# Patient Record
Sex: Female | Born: 1950 | Race: White | Hispanic: No | Marital: Married | Smoking: Former smoker
Health system: Southern US, Community
[De-identification: ages and names within clinical notes are randomized; demographics above are authoritative.]

## PROBLEM LIST (undated history)

## (undated) DIAGNOSIS — N281 Cyst of kidney, acquired: Secondary | ICD-10-CM

## (undated) DIAGNOSIS — Z87891 Personal history of nicotine dependence: Secondary | ICD-10-CM

## (undated) DIAGNOSIS — I10 Essential (primary) hypertension: Secondary | ICD-10-CM

## (undated) HISTORY — PX: BACK SURGERY: SHX140

---

## 1998-10-16 ENCOUNTER — Other Ambulatory Visit: Admission: RE | Admit: 1998-10-16 | Discharge: 1998-10-16 | Payer: Self-pay | Admitting: Obstetrics and Gynecology

## 1998-11-12 ENCOUNTER — Encounter: Payer: Self-pay | Admitting: Family Medicine

## 1998-11-12 ENCOUNTER — Ambulatory Visit (HOSPITAL_COMMUNITY): Admission: RE | Admit: 1998-11-12 | Discharge: 1998-11-12 | Payer: Self-pay | Admitting: Family Medicine

## 1999-12-01 ENCOUNTER — Other Ambulatory Visit: Admission: RE | Admit: 1999-12-01 | Discharge: 1999-12-01 | Payer: Self-pay | Admitting: Obstetrics and Gynecology

## 1999-12-16 ENCOUNTER — Ambulatory Visit (HOSPITAL_COMMUNITY): Admission: RE | Admit: 1999-12-16 | Discharge: 1999-12-16 | Payer: Self-pay | Admitting: Family Medicine

## 1999-12-28 ENCOUNTER — Encounter: Admission: RE | Admit: 1999-12-28 | Discharge: 1999-12-28 | Payer: Self-pay | Admitting: Family Medicine

## 1999-12-28 ENCOUNTER — Encounter: Payer: Self-pay | Admitting: Family Medicine

## 2000-12-29 ENCOUNTER — Other Ambulatory Visit: Admission: RE | Admit: 2000-12-29 | Discharge: 2000-12-29 | Payer: Self-pay | Admitting: Obstetrics and Gynecology

## 2002-04-12 ENCOUNTER — Other Ambulatory Visit: Admission: RE | Admit: 2002-04-12 | Discharge: 2002-04-12 | Payer: Self-pay | Admitting: Obstetrics and Gynecology

## 2002-12-10 ENCOUNTER — Encounter: Payer: Self-pay | Admitting: Emergency Medicine

## 2002-12-10 ENCOUNTER — Emergency Department (HOSPITAL_COMMUNITY): Admission: EM | Admit: 2002-12-10 | Discharge: 2002-12-10 | Payer: Self-pay | Admitting: Emergency Medicine

## 2002-12-17 ENCOUNTER — Encounter: Payer: Self-pay | Admitting: Orthopedic Surgery

## 2002-12-18 ENCOUNTER — Encounter: Payer: Self-pay | Admitting: Orthopedic Surgery

## 2002-12-19 ENCOUNTER — Inpatient Hospital Stay (HOSPITAL_COMMUNITY): Admission: RE | Admit: 2002-12-19 | Discharge: 2002-12-20 | Payer: Self-pay | Admitting: Orthopedic Surgery

## 2005-02-26 ENCOUNTER — Ambulatory Visit: Payer: Self-pay | Admitting: Family Medicine

## 2005-03-07 ENCOUNTER — Ambulatory Visit: Payer: Self-pay | Admitting: *Deleted

## 2005-03-31 ENCOUNTER — Ambulatory Visit: Payer: Self-pay | Admitting: Family Medicine

## 2005-04-09 ENCOUNTER — Ambulatory Visit (HOSPITAL_COMMUNITY): Admission: RE | Admit: 2005-04-09 | Discharge: 2005-04-09 | Payer: Self-pay | Admitting: Family Medicine

## 2014-04-15 ENCOUNTER — Emergency Department (HOSPITAL_COMMUNITY): Payer: Medicare HMO

## 2014-04-15 ENCOUNTER — Emergency Department (HOSPITAL_COMMUNITY)
Admission: EM | Admit: 2014-04-15 | Discharge: 2014-04-15 | Disposition: A | Discharge status: Home / Self Care | Payer: Medicare HMO | Source: Home / Self Care | Attending: Emergency Medicine | Admitting: Emergency Medicine

## 2014-04-15 ENCOUNTER — Encounter (HOSPITAL_COMMUNITY): Payer: Self-pay | Admitting: Emergency Medicine

## 2014-04-15 DIAGNOSIS — I10 Essential (primary) hypertension: Secondary | ICD-10-CM

## 2014-04-15 DIAGNOSIS — F17210 Nicotine dependence, cigarettes, uncomplicated: Secondary | ICD-10-CM | POA: Diagnosis present

## 2014-04-15 DIAGNOSIS — R1013 Epigastric pain: Secondary | ICD-10-CM | POA: Diagnosis not present

## 2014-04-15 DIAGNOSIS — K8000 Calculus of gallbladder with acute cholecystitis without obstruction: Secondary | ICD-10-CM | POA: Diagnosis not present

## 2014-04-15 DIAGNOSIS — Z720 Tobacco use: Secondary | ICD-10-CM | POA: Insufficient documentation

## 2014-04-15 DIAGNOSIS — M545 Low back pain: Secondary | ICD-10-CM | POA: Diagnosis present

## 2014-04-15 DIAGNOSIS — N281 Cyst of kidney, acquired: Secondary | ICD-10-CM | POA: Diagnosis present

## 2014-04-15 DIAGNOSIS — G8929 Other chronic pain: Secondary | ICD-10-CM | POA: Diagnosis present

## 2014-04-15 DIAGNOSIS — Z7982 Long term (current) use of aspirin: Secondary | ICD-10-CM | POA: Insufficient documentation

## 2014-04-15 DIAGNOSIS — R0789 Other chest pain: Secondary | ICD-10-CM | POA: Insufficient documentation

## 2014-04-15 DIAGNOSIS — Z6829 Body mass index (BMI) 29.0-29.9, adult: Secondary | ICD-10-CM

## 2014-04-15 DIAGNOSIS — Z79899 Other long term (current) drug therapy: Secondary | ICD-10-CM

## 2014-04-15 DIAGNOSIS — F1290 Cannabis use, unspecified, uncomplicated: Secondary | ICD-10-CM | POA: Diagnosis present

## 2014-04-15 HISTORY — DX: Essential (primary) hypertension: I10

## 2014-04-15 LAB — BASIC METABOLIC PANEL
Anion gap: 13 (ref 5–15)
BUN: 15 mg/dL (ref 6–23)
CO2: 25 mEq/L (ref 19–32)
Calcium: 9.7 mg/dL (ref 8.4–10.5)
Chloride: 102 mEq/L (ref 96–112)
Creatinine, Ser: 0.78 mg/dL (ref 0.50–1.10)
GFR calc Af Amer: >90 mL/min (ref >90)
GFR calc non Af Amer: 86 mL/min — ABNORMAL LOW (ref >90)
Glucose, Bld: 92 mg/dL (ref 70–99)
Potassium: 3.7 mEq/L (ref 3.7–5.3)
Sodium: 140 mEq/L (ref 137–147)

## 2014-04-15 LAB — CBC
HCT: 37.8 % (ref 36.0–46.0)
Hemoglobin: 13.2 g/dL (ref 12.0–15.0)
MCH: 31.4 pg (ref 26.0–34.0)
MCHC: 34.9 g/dL (ref 30.0–36.0)
MCV: 90 fL (ref 78.0–100.0)
Platelets: 344 10*3/uL (ref 150–400)
RBC: 4.2 MIL/uL (ref 3.87–5.11)
RDW: 14.1 % (ref 11.5–15.5)
WBC: 11.1 10*3/uL — ABNORMAL HIGH (ref 4.0–10.5)

## 2014-04-15 LAB — I-STAT TROPONIN, ED
Troponin i, poc: 0.01 ng/mL (ref 0.00–0.08)
Troponin i, poc: 0.01 ng/mL (ref 0.00–0.08)

## 2014-04-15 LAB — TROPONIN I: Troponin I: <0.3 ng/mL (ref <0.30)

## 2014-04-15 MED ORDER — ONDANSETRON HCL 4 MG/2ML IJ SOLN
4.0000 mg | Freq: Once | INTRAMUSCULAR | Status: AC
  Administered 2014-04-15: 4 mg via INTRAVENOUS
  Filled 2014-04-15: qty 2

## 2014-04-15 MED ORDER — IBUPROFEN 800 MG PO TABS: 800.0000 mg | ORAL_TABLET | Freq: Three times a day (TID) | ORAL | Status: DC | PRN

## 2014-04-15 MED ORDER — GI COCKTAIL ~~LOC~~
30.0000 mL | Freq: Once | ORAL | Status: AC
  Administered 2014-04-15: 30 mL via ORAL
  Filled 2014-04-15: qty 30

## 2014-04-15 MED ORDER — MORPHINE SULFATE 4 MG/ML IJ SOLN
4.0000 mg | Freq: Once | INTRAMUSCULAR | Status: AC
  Administered 2014-04-15: 4 mg via INTRAVENOUS
  Filled 2014-04-15: qty 1

## 2014-04-15 MED ORDER — KETOROLAC TROMETHAMINE 30 MG/ML IJ SOLN
30.0000 mg | Freq: Once | INTRAMUSCULAR | Status: AC
  Administered 2014-04-15: 30 mg via INTRAVENOUS
  Filled 2014-04-15: qty 1

## 2014-04-15 NOTE — ED Notes (Signed)
Dr. Kohut at bedside 

## 2014-04-15 NOTE — ED Notes (Signed)
Patient states chest pain starting tonight at 0130, patient states centralized chest pain radiating into back, patient denies n/v or diaphoresis, patient with htn and smoking history

## 2014-04-15 NOTE — Discharge Instructions (Signed)
Read the information below.  Use the prescribed medication as directed.  Please discuss all new medications with your pharmacist.  You may return to the Emergency Department at any time for worsening condition or any new symptoms that concern you.  If you develop worsening chest pain, shortness of breath, fever, you pass out, or become weak or dizzy, return to the ER for a recheck.      Chest Pain (Nonspecific) It is often hard to give a specific diagnosis for the cause of chest pain. There is always a chance that your pain could be related to something serious, such as a heart attack or a blood clot in the lungs. You need to follow up with your health care provider for further evaluation. CAUSES   Heartburn.  Pneumonia or bronchitis.  Anxiety or stress.  Inflammation around your heart (pericarditis) or lung (pleuritis or pleurisy).  A blood clot in the lung.  A collapsed lung (pneumothorax). It can develop suddenly on its own (spontaneous pneumothorax) or from trauma to the chest.  Shingles infection (herpes zoster virus). The chest wall is composed of bones, muscles, and cartilage. Any of these can be the source of the pain.  The bones can be bruised by injury.  The muscles or cartilage can be strained by coughing or overwork.  The cartilage can be affected by inflammation and become sore (costochondritis). DIAGNOSIS  Lab tests or other studies may be needed to find the cause of your pain. Your health care provider may have you take a test called an ambulatory electrocardiogram (ECG). An ECG records your heartbeat patterns over a 24-hour period. You may also have other tests, such as:  Transthoracic echocardiogram (TTE). During echocardiography, sound waves are used to evaluate how blood flows through your heart.  Transesophageal echocardiogram (TEE).  Cardiac monitoring. This allows your health care provider to monitor your heart rate and rhythm in real time.  Holter monitor.  This is a portable device that records your heartbeat and can help diagnose heart arrhythmias. It allows your health care provider to track your heart activity for several days, if needed.  Stress tests by exercise or by giving medicine that makes the heart beat faster. TREATMENT   Treatment depends on what may be causing your chest pain. Treatment may include:  Acid blockers for heartburn.  Anti-inflammatory medicine.  Pain medicine for inflammatory conditions.  Antibiotics if an infection is present.  You may be advised to change lifestyle habits. This includes stopping smoking and avoiding alcohol, caffeine, and chocolate.  You may be advised to keep your head raised (elevated) when sleeping. This reduces the chance of acid going backward from your stomach into your esophagus. Most of the time, nonspecific chest pain will improve within 2-3 days with rest and mild pain medicine.  HOME CARE INSTRUCTIONS   If antibiotics were prescribed, take them as directed. Finish them even if you start to feel better.  For the next few days, avoid physical activities that bring on chest pain. Continue physical activities as directed.  Do not use any tobacco products, including cigarettes, chewing tobacco, or electronic cigarettes.  Avoid drinking alcohol.  Only take medicine as directed by your health care provider.  Follow your health care provider's suggestions for further testing if your chest pain does not go away.  Keep any follow-up appointments you made. If you do not go to an appointment, you could develop lasting (chronic) problems with pain. If there is any problem keeping an appointment, call  to reschedule. SEEK MEDICAL CARE IF:   Your chest pain does not go away, even after treatment.  You have a rash with blisters on your chest.  You have a fever. SEEK IMMEDIATE MEDICAL CARE IF:   You have increased chest pain or pain that spreads to your arm, neck, jaw, back, or  abdomen.  You have shortness of breath.  You have an increasing cough, or you cough up blood.  You have severe back or abdominal pain.  You feel nauseous or vomit.  You have severe weakness.  You faint.  You have chills. This is an emergency. Do not wait to see if the pain will go away. Get medical help at once. Call your local emergency services (911 in [place] Do not drive yourself to the hospital. MAKE SURE YOU:   Understand these instructions.  Will watch your condition.  Will get help right away if you are not doing well or get worse. Document Released: 04/07/2005 Document Revised: 07/03/2013 Document Reviewed: 02/01/2008 [facility] Patient Information 2015 [employer], [place]. This information is not intended to replace advice given to you by your health care provider. Make sure you discuss any questions you have with your health care provider.

## 2014-04-15 NOTE — ED Provider Notes (Signed)
CSN: [id number]     Arrival date & time 04/15/14  0348 History   First MD Initiated Contact with Patient 04/15/14 0435     Chief Complaint  Patient presents with  . Chest Pain     (Consider location/radiation/quality/duration/timing/severity/associated sxs/prior Treatment) The history is provided by the patient.    Patient with hx HTN, smoking presents with central chest pain.  Pain began around 1:30am when she got home from work, was sitting down, drinking milk.  Pain began gradually and has steadily worsened.  Pain is located in the midsternal area, described as pressure, gradually increasing, 9/10 intensity now.  No associated symptoms.  No exacerbating or palliative factors.  Is starting to have some nausea secondary to pain.  Took aspirin, tums, alprazolam, and benadryl without improvement. Specifically denies SOB, lightheadedness, dizziness, cough, fevers, recent illness, abdominal pain.  The pain is not exertional, is not pleuritic, is not positional.  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  Has family hx of CAD, father had MI in his late 7640s.  No other known family hx CAD.  No personal or family hx blood clots.  No recent leg swelling or immobilization.  No exogenous estrogen.    Past Medical History  Diagnosis Date  . Hypertension    Past Surgical History  Procedure Laterality Date  . Back surgery     No family history on file. History  Substance Use Topics  . Smoking status: Current Some Day Smoker  . Smokeless tobacco: Not on file  . Alcohol Use: No   OB History   Grav Para Term Preterm Abortions TAB SAB Ect Mult Living                 Review of Systems  All other systems reviewed and are negative.     Allergies  Review of patient's allergies indicates no known allergies.  Home Medications   Prior to Admission medications    Medication Sig Start Date End Date Taking? Authorizing Provider  ALPRAZolam Prudy Feeler(XANAX) 1 MG tablet Take 1 mg by mouth 3 (three) times daily as needed for anxiety.   Yes Historical Provider, MD  Ascorbic Acid (VITAMIN C PO) Take 1 tablet by mouth 2 (two) times daily.   Yes Historical Provider, MD  aspirin 325 MG tablet Take 325 mg by mouth daily as needed (back pain).   Yes Historical Provider, MD  buPROPion (WELLBUTRIN SR) 150 MG 12 hr tablet Take 150 mg by mouth 2 (two) times daily.   Yes Historical Provider, MD  Calcium Carb-Cholecalciferol (CALCIUM 1000 + D PO) Take 1 tablet by mouth daily.   Yes Historical Provider, MD  Cholecalciferol (VITAMIN D PO) Take 1 tablet by mouth 2 (two) times daily.   Yes Historical Provider, MD  diphenhydrAMINE (BENADRYL) 25 mg capsule Take 25 mg by mouth every 6 (six) hours as needed for itching or allergies.   Yes Historical Provider, MD  HYDROcodone-acetaminophen (NORCO) 7.5-325 MG per tablet Take 1 tablet by mouth every 6 (six) hours as needed for moderate pain.   Yes Historical Provider, MD  lisinopril (PRINIVIL,ZESTRIL) 5 MG tablet Take 5 mg by mouth daily.   Yes Historical Provider, MD  Multiple Vitamin (MULTIVITAMIN WITH MINERALS) TABS tablet Take 1 tablet by mouth daily.   Yes Historical Provider, MD   BP 172/90  Pulse  86  Temp(Src) 98.1 F (36.7 C) (Oral)  Resp 11  Ht 5\' 1"  (1.549 m)  Wt 155 lb (70.308 kg)  BMI 29.30 kg/m2  SpO2 98% Physical Exam  Nursing note and vitals reviewed. Constitutional: She appears well-developed and well-nourished. No distress.  HENT:  Head: Normocephalic and atraumatic.  Neck: Neck supple.  Cardiovascular: Normal rate, regular rhythm and intact distal pulses.   Pulmonary/Chest: Effort normal and breath sounds normal. No respiratory distress. She has no wheezes. She has no rales.  Abdominal: Soft. She exhibits no distension. There is no tenderness. There is no rebound and no guarding.  Musculoskeletal: She exhibits no  edema and no tenderness.  Neurological: She is alert.  Skin: She is not diaphoretic.  Psychiatric: She has a normal mood and affect. Her behavior is normal.    ED Course  Procedures (including critical care time) Labs Review Labs Reviewed  CBC - Abnormal; Notable for the following:    WBC 11.1 (*)    All other components within normal limits  BASIC METABOLIC PANEL - Abnormal; Notable for the following:    GFR calc non Af Amer 86 (*)    All other components within normal limits  I-STAT TROPOININ, ED  Rosezena Sensor, ED    Imaging Review Dg Chest Port 1 View  04/15/2014   CLINICAL DATA:  Initial evaluation for chest pain  EXAM: PORTABLE CHEST - 1 VIEW  COMPARISON:  None.  FINDINGS: The cardiac and mediastinal silhouettes are within normal limits.  The lungs are normally inflated. No airspace consolidation, pleural effusion, or pulmonary edema is identified. There is no pneumothorax.  No acute osseous abnormality identified.  IMPRESSION: No acute cardiopulmonary abnormality.   Electronically Signed   By: Rise Mu M.D.   On: 04/15/2014 05:25     EKG Interpretation None       Date: 04/15/2014  Rate: 80  Rhythm: normal sinus rhythm and premature ventricular contractions (PVC)  QRS Axis: normal  Intervals: normal  ST/T Wave abnormalities: normal  Conduction Disutrbances:none  Narrative Interpretation: possible left atrial enlargement.   Old EKG Reviewed: none available    Well's PE criteria score zero.  HEART score 2  Discussed pt with Dr Juleen China who will also see the patient.  8:10 AM Discussed with Dr Juleen China.  Will repeat troponin.  If negative, plan for d/c home.    MDM   Final diagnoses:  Atypical chest pain    Afebrile, nontoxic patient with central chest pain without associated symptoms or exacerbating or palliative factors.  Pain has been constant x hours.  Repeat troponins negative.  EKG unremarkable.  Pain control achieved in ED.  D/C home with  ibuprofen.  Pt has norco at home.   PCP follow up, Renaye Rakers. Discussed result, findings, treatment, and follow up  with patient.  Pt given return precautions.  Pt verbalizes understanding and agrees with plan.         Trixie Dredge, PA-C 04/15/14 1243

## 2014-04-17 ENCOUNTER — Emergency Department (HOSPITAL_COMMUNITY): Payer: Medicare HMO

## 2014-04-17 ENCOUNTER — Observation Stay (HOSPITAL_COMMUNITY): Payer: Medicare HMO

## 2014-04-17 ENCOUNTER — Encounter (HOSPITAL_COMMUNITY): Payer: Self-pay | Admitting: Emergency Medicine

## 2014-04-17 ENCOUNTER — Observation Stay (HOSPITAL_COMMUNITY): Payer: Medicare HMO | Admitting: Anesthesiology

## 2014-04-17 ENCOUNTER — Encounter (HOSPITAL_COMMUNITY): Admission: EM | Disposition: A | Discharge status: Home / Self Care | Payer: Self-pay | Source: Home / Self Care

## 2014-04-17 ENCOUNTER — Encounter (HOSPITAL_COMMUNITY): Payer: Medicare HMO | Admitting: Anesthesiology

## 2014-04-17 ENCOUNTER — Inpatient Hospital Stay (HOSPITAL_COMMUNITY)
Admission: EM | Admit: 2014-04-17 | Discharge: 2014-04-19 | DRG: 419 | Disposition: A | Discharge status: Home / Self Care | Payer: Medicare HMO | Attending: General Surgery | Admitting: General Surgery

## 2014-04-17 DIAGNOSIS — Z6829 Body mass index (BMI) 29.0-29.9, adult: Secondary | ICD-10-CM | POA: Diagnosis not present

## 2014-04-17 DIAGNOSIS — R1013 Epigastric pain: Secondary | ICD-10-CM | POA: Diagnosis present

## 2014-04-17 DIAGNOSIS — K8020 Calculus of gallbladder without cholecystitis without obstruction: Secondary | ICD-10-CM

## 2014-04-17 DIAGNOSIS — K8000 Calculus of gallbladder with acute cholecystitis without obstruction: Secondary | ICD-10-CM | POA: Diagnosis present

## 2014-04-17 DIAGNOSIS — K8010 Calculus of gallbladder with chronic cholecystitis without obstruction: Secondary | ICD-10-CM | POA: Diagnosis present

## 2014-04-17 DIAGNOSIS — Z87891 Personal history of nicotine dependence: Secondary | ICD-10-CM

## 2014-04-17 DIAGNOSIS — N281 Cyst of kidney, acquired: Secondary | ICD-10-CM | POA: Diagnosis present

## 2014-04-17 DIAGNOSIS — I10 Essential (primary) hypertension: Secondary | ICD-10-CM | POA: Diagnosis present

## 2014-04-17 DIAGNOSIS — G8929 Other chronic pain: Secondary | ICD-10-CM | POA: Diagnosis present

## 2014-04-17 DIAGNOSIS — F1290 Cannabis use, unspecified, uncomplicated: Secondary | ICD-10-CM | POA: Diagnosis present

## 2014-04-17 DIAGNOSIS — K819 Cholecystitis, unspecified: Secondary | ICD-10-CM

## 2014-04-17 DIAGNOSIS — F17210 Nicotine dependence, cigarettes, uncomplicated: Secondary | ICD-10-CM | POA: Diagnosis present

## 2014-04-17 DIAGNOSIS — M545 Low back pain: Secondary | ICD-10-CM | POA: Diagnosis present

## 2014-04-17 HISTORY — DX: Essential (primary) hypertension: I10

## 2014-04-17 HISTORY — PX: CHOLECYSTECTOMY: SHX55

## 2014-04-17 HISTORY — DX: Cyst of kidney, acquired: N28.1

## 2014-04-17 HISTORY — DX: Personal history of nicotine dependence: Z87.891

## 2014-04-17 LAB — BASIC METABOLIC PANEL
ANION GAP: 18 — AB (ref 5–15)
BUN: 13 mg/dL (ref 6–23)
CHLORIDE: 96 meq/L (ref 96–112)
CO2: 21 mEq/L (ref 19–32)
CREATININE: 0.72 mg/dL (ref 0.50–1.10)
Calcium: 10.3 mg/dL (ref 8.4–10.5)
GFR, EST NON AFRICAN AMERICAN: 88 mL/min — AB (ref >90)
Glucose, Bld: 99 mg/dL (ref 70–99)
Potassium: 3.9 mEq/L (ref 3.7–5.3)
Sodium: 135 mEq/L — ABNORMAL LOW (ref 137–147)

## 2014-04-17 LAB — D-DIMER, QUANTITATIVE: D-Dimer, Quant: 0.54 ug/mL-FEU — ABNORMAL HIGH (ref 0.00–0.48)

## 2014-04-17 LAB — CBC
HCT: 44.6 % (ref 36.0–46.0)
Hemoglobin: 15.5 g/dL — ABNORMAL HIGH (ref 12.0–15.0)
MCH: 31.9 pg (ref 26.0–34.0)
MCHC: 34.8 g/dL (ref 30.0–36.0)
MCV: 91.8 fL (ref 78.0–100.0)
PLATELETS: 273 10*3/uL (ref 150–400)
RBC: 4.86 MIL/uL (ref 3.87–5.11)
RDW: 14.1 % (ref 11.5–15.5)
WBC: 15.3 10*3/uL — AB (ref 4.0–10.5)

## 2014-04-17 LAB — I-STAT CG4 LACTIC ACID, ED: Lactic Acid, Venous: 1.56 mmol/L (ref 0.5–2.2)

## 2014-04-17 LAB — LIPASE, BLOOD: LIPASE: 25 U/L (ref 11–59)

## 2014-04-17 LAB — HEPATIC FUNCTION PANEL
ALBUMIN: 4.6 g/dL (ref 3.5–5.2)
ALK PHOS: 115 U/L (ref 39–117)
ALT: 71 U/L — AB (ref 0–35)
AST: 49 U/L — AB (ref 0–37)
Bilirubin, Direct: <0.2 mg/dL (ref 0.0–0.3)
Total Bilirubin: 0.3 mg/dL (ref 0.3–1.2)
Total Protein: 8.6 g/dL — ABNORMAL HIGH (ref 6.0–8.3)

## 2014-04-17 LAB — I-STAT TROPONIN, ED: Troponin i, poc: 0 ng/mL (ref 0.00–0.08)

## 2014-04-17 LAB — TROPONIN I

## 2014-04-17 SURGERY — LAPAROSCOPIC CHOLECYSTECTOMY WITH INTRAOPERATIVE CHOLANGIOGRAM
Anesthesia: General | Site: Abdomen

## 2014-04-17 MED ORDER — ROCURONIUM BROMIDE 100 MG/10ML IV SOLN
INTRAVENOUS | Status: AC
  Filled 2014-04-17: qty 1

## 2014-04-17 MED ORDER — NEOSTIGMINE METHYLSULFATE 10 MG/10ML IV SOLN
INTRAVENOUS | Status: DC | PRN
  Administered 2014-04-17: 3 mg via INTRAVENOUS

## 2014-04-17 MED ORDER — ONDANSETRON HCL 4 MG/2ML IJ SOLN
INTRAMUSCULAR | Status: AC
  Filled 2014-04-17: qty 2

## 2014-04-17 MED ORDER — MORPHINE SULFATE 4 MG/ML IJ SOLN
4.0000 mg | Freq: Once | INTRAMUSCULAR | Status: AC
  Administered 2014-04-17: 4 mg via INTRAVENOUS
  Filled 2014-04-17: qty 1

## 2014-04-17 MED ORDER — ONDANSETRON HCL 4 MG/2ML IJ SOLN
INTRAMUSCULAR | Status: DC | PRN
  Administered 2014-04-17: 4 mg via INTRAVENOUS

## 2014-04-17 MED ORDER — PROPOFOL 10 MG/ML IV BOLUS
INTRAVENOUS | Status: DC | PRN
  Administered 2014-04-17: 150 mg via INTRAVENOUS

## 2014-04-17 MED ORDER — LISINOPRIL 5 MG PO TABS
5.0000 mg | ORAL_TABLET | Freq: Every day | ORAL | Status: DC
  Administered 2014-04-18 – 2014-04-19 (×2): 5 mg via ORAL
  Filled 2014-04-17 (×3): qty 1

## 2014-04-17 MED ORDER — NEOSTIGMINE METHYLSULFATE 10 MG/10ML IV SOLN
INTRAVENOUS | Status: AC
  Filled 2014-04-17: qty 1

## 2014-04-17 MED ORDER — HYDROMORPHONE HCL 1 MG/ML IJ SOLN
0.2500 mg | INTRAMUSCULAR | Status: DC | PRN
  Administered 2014-04-17 (×2): 0.5 mg via INTRAVENOUS

## 2014-04-17 MED ORDER — ENOXAPARIN SODIUM 40 MG/0.4ML ~~LOC~~ SOLN
40.0000 mg | SUBCUTANEOUS | Status: DC
  Filled 2014-04-17: qty 0.4

## 2014-04-17 MED ORDER — ONDANSETRON HCL 4 MG/2ML IJ SOLN
4.0000 mg | Freq: Once | INTRAMUSCULAR | Status: AC
  Administered 2014-04-17: 4 mg via INTRAVENOUS
  Filled 2014-04-17: qty 2

## 2014-04-17 MED ORDER — NICOTINE 21 MG/24HR TD PT24
21.0000 mg | MEDICATED_PATCH | Freq: Every day | TRANSDERMAL | Status: DC
  Administered 2014-04-18 – 2014-04-19 (×2): 21 mg via TRANSDERMAL
  Filled 2014-04-17 (×3): qty 1

## 2014-04-17 MED ORDER — FENTANYL CITRATE 0.05 MG/ML IJ SOLN
INTRAMUSCULAR | Status: DC | PRN
  Administered 2014-04-17: 50 ug via INTRAVENOUS
  Administered 2014-04-17: 100 ug via INTRAVENOUS
  Administered 2014-04-17: 50 ug via INTRAVENOUS

## 2014-04-17 MED ORDER — DIATRIZOATE MEGLUMINE 30 % UR SOLN
URETHRAL | Status: DC | PRN
  Administered 2014-04-17: 14 mL

## 2014-04-17 MED ORDER — ALPRAZOLAM 1 MG PO TABS
1.0000 mg | ORAL_TABLET | Freq: Three times a day (TID) | ORAL | Status: DC | PRN
  Administered 2014-04-17 – 2014-04-19 (×2): 1 mg via ORAL
  Filled 2014-04-17 (×2): qty 1

## 2014-04-17 MED ORDER — MIDAZOLAM HCL 2 MG/2ML IJ SOLN
INTRAMUSCULAR | Status: AC
  Filled 2014-04-17: qty 2

## 2014-04-17 MED ORDER — HYDROMORPHONE HCL 1 MG/ML IJ SOLN
INTRAMUSCULAR | Status: AC
  Filled 2014-04-17: qty 1

## 2014-04-17 MED ORDER — ROCURONIUM BROMIDE 100 MG/10ML IV SOLN
INTRAVENOUS | Status: DC | PRN
  Administered 2014-04-17: 25 mg via INTRAVENOUS

## 2014-04-17 MED ORDER — LACTATED RINGERS IV SOLN
INTRAVENOUS | Status: DC | PRN
  Administered 2014-04-17: 1000 mL

## 2014-04-17 MED ORDER — PROMETHAZINE HCL 25 MG/ML IJ SOLN: 6.2500 mg | INTRAMUSCULAR | Status: DC | PRN

## 2014-04-17 MED ORDER — LIDOCAINE HCL (CARDIAC) 20 MG/ML IV SOLN
INTRAVENOUS | Status: DC | PRN
  Administered 2014-04-17: 40 mg via INTRAVENOUS

## 2014-04-17 MED ORDER — CEFAZOLIN SODIUM 1-5 GM-% IV SOLN
1.0000 g | Freq: Three times a day (TID) | INTRAVENOUS | Status: DC
  Administered 2014-04-17 – 2014-04-19 (×5): 1 g via INTRAVENOUS
  Filled 2014-04-17 (×6): qty 50

## 2014-04-17 MED ORDER — SODIUM CHLORIDE 0.9 % IV BOLUS (SEPSIS)
1000.0000 mL | Freq: Once | INTRAVENOUS | Status: AC
  Administered 2014-04-17: 1000 mL via INTRAVENOUS

## 2014-04-17 MED ORDER — HEPARIN SODIUM (PORCINE) 5000 UNIT/ML IJ SOLN
5000.0000 [IU] | Freq: Three times a day (TID) | INTRAMUSCULAR | Status: DC
  Administered 2014-04-17 – 2014-04-19 (×5): 5000 [IU] via SUBCUTANEOUS
  Filled 2014-04-17 (×8): qty 1

## 2014-04-17 MED ORDER — ACETAMINOPHEN 325 MG PO TABS: 650.0000 mg | ORAL_TABLET | Freq: Four times a day (QID) | ORAL | Status: DC | PRN

## 2014-04-17 MED ORDER — GLYCOPYRROLATE 0.2 MG/ML IJ SOLN
INTRAMUSCULAR | Status: DC | PRN
  Administered 2014-04-17: 0.4 mg via INTRAVENOUS

## 2014-04-17 MED ORDER — LACTATED RINGERS IV SOLN
INTRAVENOUS | Status: DC | PRN
  Administered 2014-04-17: 1000 mL
  Administered 2014-04-17: 11:00:00 via INTRAVENOUS

## 2014-04-17 MED ORDER — BUPIVACAINE-EPINEPHRINE (PF) 0.25% -1:200000 IJ SOLN
INTRAMUSCULAR | Status: AC
  Filled 2014-04-17: qty 30

## 2014-04-17 MED ORDER — IOHEXOL 300 MG/ML  SOLN
50.0000 mL | Freq: Once | INTRAMUSCULAR | Status: AC | PRN
Start: 2014-04-17 — End: 2014-04-17
  Administered 2014-04-17: 50 mL via ORAL

## 2014-04-17 MED ORDER — CEFAZOLIN SODIUM-DEXTROSE 2-3 GM-% IV SOLR
INTRAVENOUS | Status: AC
  Filled 2014-04-17: qty 50

## 2014-04-17 MED ORDER — CEFAZOLIN SODIUM-DEXTROSE 2-3 GM-% IV SOLR
2.0000 g | INTRAVENOUS | Status: AC
  Administered 2014-04-17: 2 g via INTRAVENOUS

## 2014-04-17 MED ORDER — ONDANSETRON HCL 4 MG/2ML IJ SOLN: 4.0000 mg | Freq: Four times a day (QID) | INTRAMUSCULAR | Status: DC | PRN

## 2014-04-17 MED ORDER — MORPHINE SULFATE 2 MG/ML IJ SOLN: 2.0000 mg | INTRAMUSCULAR | Status: DC | PRN

## 2014-04-17 MED ORDER — MORPHINE SULFATE 2 MG/ML IJ SOLN
1.0000 mg | INTRAMUSCULAR | Status: DC | PRN
  Administered 2014-04-18: 2 mg via INTRAVENOUS
  Filled 2014-04-17: qty 1

## 2014-04-17 MED ORDER — HYDROCODONE-ACETAMINOPHEN 5-325 MG PO TABS
1.0000 | ORAL_TABLET | ORAL | Status: DC | PRN
  Administered 2014-04-17 – 2014-04-19 (×6): 2 via ORAL
  Filled 2014-04-17 (×6): qty 2

## 2014-04-17 MED ORDER — ADULT MULTIVITAMIN W/MINERALS CH
1.0000 | ORAL_TABLET | Freq: Every day | ORAL | Status: DC
  Administered 2014-04-18 – 2014-04-19 (×2): 1 via ORAL
  Filled 2014-04-17 (×2): qty 1

## 2014-04-17 MED ORDER — FENTANYL CITRATE 0.05 MG/ML IJ SOLN
INTRAMUSCULAR | Status: AC
  Filled 2014-04-17: qty 5

## 2014-04-17 MED ORDER — PROPOFOL 10 MG/ML IV BOLUS
INTRAVENOUS | Status: AC
  Filled 2014-04-17: qty 20

## 2014-04-17 MED ORDER — ONDANSETRON HCL 4 MG PO TABS: 4.0000 mg | ORAL_TABLET | Freq: Four times a day (QID) | ORAL | Status: DC | PRN

## 2014-04-17 MED ORDER — LIDOCAINE HCL (CARDIAC) 20 MG/ML IV SOLN
INTRAVENOUS | Status: AC
  Filled 2014-04-17: qty 5

## 2014-04-17 MED ORDER — KCL IN DEXTROSE-NACL 40-5-0.45 MEQ/L-%-% IV SOLN
INTRAVENOUS | Status: DC
  Administered 2014-04-17: 100 mL/h via INTRAVENOUS
  Administered 2014-04-18 – 2014-04-19 (×3): via INTRAVENOUS
  Filled 2014-04-17 (×6): qty 1000

## 2014-04-17 MED ORDER — MIDAZOLAM HCL 5 MG/5ML IJ SOLN
INTRAMUSCULAR | Status: DC | PRN
  Administered 2014-04-17: 2 mg via INTRAVENOUS

## 2014-04-17 MED ORDER — LABETALOL HCL 5 MG/ML IV SOLN
INTRAVENOUS | Status: DC | PRN
  Administered 2014-04-17: 5 mg via INTRAVENOUS

## 2014-04-17 MED ORDER — IBUPROFEN 200 MG PO TABS: 600.0000 mg | ORAL_TABLET | Freq: Four times a day (QID) | ORAL | Status: DC | PRN

## 2014-04-17 MED ORDER — HYDROCODONE-ACETAMINOPHEN 5-325 MG PO TABS: 1.0000 | ORAL_TABLET | ORAL | Status: DC | PRN

## 2014-04-17 MED ORDER — PNEUMOCOCCAL VAC POLYVALENT 25 MCG/0.5ML IJ INJ
0.5000 mL | INJECTION | INTRAMUSCULAR | Status: AC
  Administered 2014-04-18: 0.5 mL via INTRAMUSCULAR
  Filled 2014-04-17 (×2): qty 0.5

## 2014-04-17 MED ORDER — SUCCINYLCHOLINE CHLORIDE 20 MG/ML IJ SOLN
INTRAMUSCULAR | Status: DC | PRN
  Administered 2014-04-17: 100 mg via INTRAVENOUS

## 2014-04-17 MED ORDER — BUPIVACAINE HCL (PF) 0.25 % IJ SOLN
INTRAMUSCULAR | Status: DC | PRN
  Administered 2014-04-17: 15 mL
  Administered 2014-04-17: 20 mL

## 2014-04-17 MED ORDER — DIPHENHYDRAMINE HCL 25 MG PO CAPS: 25.0000 mg | ORAL_CAPSULE | Freq: Four times a day (QID) | ORAL | Status: DC | PRN

## 2014-04-17 MED ORDER — SODIUM CHLORIDE 0.9 % IV SOLN: INTRAVENOUS | Status: DC

## 2014-04-17 MED ORDER — BUPROPION HCL ER (SR) 150 MG PO TB12
150.0000 mg | ORAL_TABLET | Freq: Two times a day (BID) | ORAL | Status: DC
  Administered 2014-04-18 – 2014-04-19 (×2): 150 mg via ORAL
  Filled 2014-04-17 (×6): qty 1

## 2014-04-17 MED ORDER — GLYCOPYRROLATE 0.2 MG/ML IJ SOLN
INTRAMUSCULAR | Status: AC
  Filled 2014-04-17: qty 2

## 2014-04-17 MED ORDER — INFLUENZA VAC SPLIT QUAD 0.5 ML IM SUSY
0.5000 mL | PREFILLED_SYRINGE | INTRAMUSCULAR | Status: AC
  Administered 2014-04-18: 0.5 mL via INTRAMUSCULAR
  Filled 2014-04-17 (×2): qty 0.5

## 2014-04-17 MED ORDER — IOHEXOL 300 MG/ML  SOLN
100.0000 mL | Freq: Once | INTRAMUSCULAR | Status: AC | PRN
  Administered 2014-04-17: 100 mL via INTRAVENOUS

## 2014-04-17 MED ORDER — DEXAMETHASONE SODIUM PHOSPHATE 10 MG/ML IJ SOLN
INTRAMUSCULAR | Status: DC | PRN
  Administered 2014-04-17: 10 mg via INTRAVENOUS

## 2014-04-17 MED ORDER — LABETALOL HCL 5 MG/ML IV SOLN
INTRAVENOUS | Status: AC
  Filled 2014-04-17: qty 4

## 2014-04-17 MED ORDER — ACETAMINOPHEN 650 MG RE SUPP: 650.0000 mg | Freq: Four times a day (QID) | RECTAL | Status: DC | PRN

## 2014-04-17 SURGICAL SUPPLY — 41 items
APPLIER CLIP ROT 10 11.4 M/L (STAPLE) ×3
BENZOIN TINCTURE PRP APPL 2/3 (GAUZE/BANDAGES/DRESSINGS) ×3 IMPLANT
CABLE HIGH FREQUENCY MONO STRZ (ELECTRODE) ×3 IMPLANT
CANISTER SUCTION 2500CC (MISCELLANEOUS) ×3 IMPLANT
CHLORAPREP W/TINT 26ML (MISCELLANEOUS) ×3 IMPLANT
CHOLANGIOGRAM CATH TAUT (CATHETERS) ×3 IMPLANT
CLIP APPLIE ROT 10 11.4 M/L (STAPLE) ×1 IMPLANT
CLOSURE WOUND 1/4X4 (GAUZE/BANDAGES/DRESSINGS) ×1
COVER MAYO STAND STRL (DRAPES) ×3 IMPLANT
DECANTER SPIKE VIAL GLASS SM (MISCELLANEOUS) ×3 IMPLANT
DERMABOND ADVANCED (GAUZE/BANDAGES/DRESSINGS) ×2
DERMABOND ADVANCED .7 DNX12 (GAUZE/BANDAGES/DRESSINGS) ×1 IMPLANT
DRAPE C-ARM 42X120 X-RAY (DRAPES) ×3 IMPLANT
DRAPE LAPAROSCOPIC ABDOMINAL (DRAPES) ×3 IMPLANT
DRAPE UTILITY XL STRL (DRAPES) ×3 IMPLANT
ELECT REM PT RETURN 9FT ADLT (ELECTROSURGICAL) ×3
ELECTRODE REM PT RTRN 9FT ADLT (ELECTROSURGICAL) ×1 IMPLANT
GLOVE SURG SIGNA 7.5 PF LTX (GLOVE) ×3 IMPLANT
GOWN SPEC L4 XLG W/TWL (GOWN DISPOSABLE) ×3 IMPLANT
GOWN STRL REUS W/ TWL XL LVL3 (GOWN DISPOSABLE) ×3 IMPLANT
GOWN STRL REUS W/TWL XL LVL3 (GOWN DISPOSABLE) ×6
HEMOSTAT SURGICEL 4X8 (HEMOSTASIS) IMPLANT
IV CATH 14GX2 1/4 (CATHETERS) ×3 IMPLANT
IV SET MACRO CATH EXT 6 LUER (IV SETS) ×3 IMPLANT
KIT BASIN OR (CUSTOM PROCEDURE TRAY) ×3 IMPLANT
NS IRRIG 1000ML POUR BTL (IV SOLUTION) ×3 IMPLANT
POUCH SPECIMEN RETRIEVAL 10MM (ENDOMECHANICALS) ×3 IMPLANT
SCISSORS LAP 5X35 DISP (ENDOMECHANICALS) ×3 IMPLANT
SET IRRIG TUBING LAPAROSCOPIC (IRRIGATION / IRRIGATOR) ×3 IMPLANT
SLEEVE XCEL OPT CAN 5 100 (ENDOMECHANICALS) ×3 IMPLANT
SOLUTION ANTI FOG 6CC (MISCELLANEOUS) ×3 IMPLANT
STOPCOCK 4 WAY LG BORE MALE ST (IV SETS) ×3 IMPLANT
STRIP CLOSURE SKIN 1/4X4 (GAUZE/BANDAGES/DRESSINGS) ×2 IMPLANT
SUT VIC AB 5-0 PS2 18 (SUTURE) ×3 IMPLANT
TOWEL OR 17X26 10 PK STRL BLUE (TOWEL DISPOSABLE) ×3 IMPLANT
TOWEL OR NON WOVEN STRL DISP B (DISPOSABLE) ×3 IMPLANT
TRAY LAP CHOLE (CUSTOM PROCEDURE TRAY) ×3 IMPLANT
TROCAR BLADELESS OPT 5 100 (ENDOMECHANICALS) ×6 IMPLANT
TROCAR XCEL BLUNT TIP 100MML (ENDOMECHANICALS) ×3 IMPLANT
TROCAR XCEL NON-BLD 11X100MML (ENDOMECHANICALS) ×3 IMPLANT
TUBING INSUFFLATION 10FT LAP (TUBING) ×3 IMPLANT

## 2014-04-17 NOTE — ED Notes (Signed)
Lab phlebotomist called to assist with blood draw. Attempts to draw blood by 3 different RN/NTs have been minimally successful.

## 2014-04-17 NOTE — ED Provider Notes (Signed)
CSN: [id number]     Arrival date & time 04/17/14  0020 History   First MD Initiated Contact with Patient 04/17/14 [phone]     Chief Complaint  Patient presents with  . Chest Pain  . Abdominal Pain  . Back Pain     (Consider location/radiation/quality/duration/timing/severity/associated sxs/prior Treatment) HPI Comments: Pt with hx of HTN, comes in with chest pain, back pain. Pain onset was Monday middle of the night. Pain is constant, pressure like pain, squeezing pain. Pt has associated nausea, no emesis. No hx of same pain before. No hx of substance abuse, no hx of liver disease, alcohol drinking, not an active smoker. + CAd hx in family - father in the 20s. No numbness, tingling. No recent travel hx - except for [place] in September - when she did go out in the woods, no tick bites.  Patient is a 63 y.o. female presenting with chest pain, abdominal pain, and back pain. The history is provided by the patient.  Chest Pain Associated symptoms: abdominal pain and back pain   Associated symptoms: no headache, no nausea, no shortness of breath and not vomiting   Abdominal Pain Associated symptoms: chest pain   Associated symptoms: no dysuria, no nausea, no shortness of breath and no vomiting   Back Pain Associated symptoms: abdominal pain and chest pain   Associated symptoms: no dysuria and no headaches     Past Medical History  Diagnosis Date  . Hypertension   . Essential hypertension 04/19/2014  . Hx of tobacco use, presenting hazards to health 04/19/2014  . Renal cyst, left 04/19/2014   Past Surgical History  Procedure Laterality Date  . Back surgery    . Cholecystectomy N/A 04/17/2014    Procedure: LAPAROSCOPIC CHOLECYSTECTOMY WITH INTRAOPERATIVE CHOLANGIOGRAM;  Surgeon: Ovidio Kin, MD;  Location: [facility];  Service: General;  Laterality: N/A;   History reviewed. No pertinent family history. History  Substance Use Topics  . Smoking status: Former Smoker    Quit date: 03/22/2014  .  Smokeless tobacco: Never Used  . Alcohol Use: No   OB History   Grav Para Term Preterm Abortions TAB SAB Ect Mult Living                 Review of Systems  Constitutional: Positive for activity change.  Respiratory: Negative for shortness of breath.   Cardiovascular: Positive for chest pain.  Gastrointestinal: Positive for abdominal pain. Negative for nausea and vomiting.  Genitourinary: Negative for dysuria.  Musculoskeletal: Positive for back pain. Negative for neck pain.  Allergic/Immunologic: Negative for immunocompromised state.  Neurological: Negative for headaches.  Psychiatric/Behavioral: Negative for confusion.  All other systems reviewed and are negative.     Allergies  Review of patient's allergies indicates no known allergies.  Home Medications   Prior to Admission medications   Medication Sig Start Date End Date Taking? Authorizing Provider  ALPRAZolam Prudy Feeler) 1 MG tablet Take 1 mg by mouth 3 (three) times daily as needed for anxiety.   Yes Historical Provider, MD  Ascorbic Acid (VITAMIN C PO) Take 1 tablet by mouth 2 (two) times daily.   Yes Historical Provider, MD  aspirin 325 MG tablet Take 325 mg by mouth daily as needed (back pain).   Yes Historical Provider, MD  buPROPion (WELLBUTRIN SR) 150 MG 12 hr tablet Take 150 mg by mouth 2 (two) times daily.   Yes Historical Provider, MD  Calcium Carb-Cholecalciferol (CALCIUM 1000 + D PO) Take 1 tablet by mouth daily.  Yes Historical Provider, MD  Cholecalciferol (VITAMIN D PO) Take 1 tablet by mouth 2 (two) times daily.   Yes Historical Provider, MD  diphenhydrAMINE (BENADRYL) 25 mg capsule Take 25 mg by mouth every 6 (six) hours as needed for itching or allergies.   Yes Historical Provider, MD  HYDROcodone-acetaminophen (NORCO) 7.5-325 MG per tablet Take 1 tablet by mouth every 6 (six) hours as needed for moderate pain.   Yes Historical Provider, MD  lisinopril (PRINIVIL,ZESTRIL) 5 MG tablet Take 5 mg by mouth daily.    Yes Historical Provider, MD  Multiple Vitamin (MULTIVITAMIN WITH MINERALS) TABS tablet Take 1 tablet by mouth daily.   Yes Historical Provider, MD  nicotine (NICODERM CQ - DOSED IN MG/24 HOURS) 21 mg/24hr patch Place 21 mg onto the skin daily.   Yes Historical Provider, MD  acetaminophen (TYLENOL) 325 MG tablet Take 2 tablets (650 mg total) by mouth every 6 (six) hours as needed for mild pain (or Temp > 100). 04/19/14   Sherrie GeorgeWillard Jennings, PA-C  HYDROcodone-acetaminophen (NORCO/VICODIN) 5-325 MG per tablet Take 1-2 tablets by mouth every 4 (four) hours as needed for moderate pain. 04/19/14   Sherrie GeorgeWillard Jennings, PA-C  ibuprofen (ADVIL,MOTRIN) 200 MG tablet You can take 2-3 every 6 hours for pain as needed.  To much of this is hard on your GI tract and kidneys. 04/19/14   Sherrie GeorgeWillard Jennings, PA-C   BP 114/66  Pulse 84  Temp(Src) 98.1 F (36.7 C) (Oral)  Resp 16  Ht 5\' 1"  (1.549 m)  Wt 155 lb (70.308 kg)  BMI 29.30 kg/m2  SpO2 96% Physical Exam  Nursing note and vitals reviewed. Constitutional: She is oriented to person, place, and time. She appears well-developed and well-nourished.  HENT:  Head: Normocephalic and atraumatic.  Eyes: EOM are normal. Pupils are equal, round, and reactive to light.  Neck: Neck supple.  Cardiovascular: Normal rate, regular rhythm and normal heart sounds.   No murmur heard. Pulmonary/Chest: Effort normal. No respiratory distress.  Abdominal: Soft. She exhibits no distension. There is no tenderness. There is no rebound and no guarding.  Neurological: She is alert and oriented to person, place, and time.  Skin: Skin is warm and dry.    ED Course  Procedures (including critical care time) Labs Review Labs Reviewed  CBC - Abnormal; Notable for the following:    WBC 15.3 (*)    Hemoglobin 15.5 (*)    All other components within normal limits  BASIC METABOLIC PANEL - Abnormal; Notable for the following:    Sodium 135 (*)    GFR calc non Af Amer 88 (*)    Anion  gap 18 (*)    All other components within normal limits  D-DIMER, QUANTITATIVE - Abnormal; Notable for the following:    D-Dimer, Quant 0.54 (*)    All other components within normal limits  HEPATIC FUNCTION PANEL - Abnormal; Notable for the following:    Total Protein 8.6 (*)    AST 49 (*)    ALT 71 (*)    All other components within normal limits  COMPREHENSIVE METABOLIC PANEL - Abnormal; Notable for the following:    Glucose, Bld 123 (*)    Albumin 3.0 (*)    AST 87 (*)    ALT 110 (*)    All other components within normal limits  CBC - Abnormal; Notable for the following:    RBC 3.57 (*)    Hemoglobin 11.0 (*)    HCT 33.1 (*)  All other components within normal limits  BASIC METABOLIC PANEL - Abnormal; Notable for the following:    Glucose, Bld 111 (*)    GFR calc non Af Amer 90 (*)    All other components within normal limits  TROPONIN I  LIPASE, BLOOD  I-STAT TROPOININ, ED  I-STAT CG4 LACTIC ACID, ED  I-STAT TROPOININ, ED  SURGICAL PATHOLOGY    Imaging Review Dg Cholangiogram Operative  04/17/2014   CLINICAL DATA:  Intraoperative cholangiogram during laparoscopic cholecystectomy. Evaluate for patency of the common bile duct. Initial encounter appear  EXAM: INTRAOPERATIVE CHOLANGIOGRAM  FLUOROSCOPY TIME:  14 seconds  COMPARISON:  CT abdomen pelvis -04/17/2014  FINDINGS: Intraoperative angiographic images of the right upper abdominal quadrant during laparoscopic cholecystectomy are provided for review.  Surgical clips overlie the expected location of the gallbladder fossa.  Contrast injection demonstrates selective cannulation of the central aspect of the cystic duct.  There is passage of contrast through the central aspect of the cystic duct with filling of a non dilated common bile duct. There is passage of contrast though the CBD and into the descending portion of the duodenum.  There is minimal reflux of injected contrast into the common hepatic duct and central aspect of  the non dilated intrahepatic biliary system.  There are no discrete filling defects within the opacified portions of the biliary system to suggest the presence of choledocholithiasis.  IMPRESSION: No evidence of choledocholithiasis.   Electronically Signed   By: Simonne Come M.D.   On: 04/17/2014 12:51   US Renal  04/18/2014   CLINICAL DATA:  Abnormal CT exam demonstrating a lesion arising from the inferior pole of the LEFT kidney  EXAM: RENAL/URINARY TRACT ULTRASOUND COMPLETE  COMPARISON:  CT abdomen and pelvis 04/17/2014  FINDINGS: Right Kidney:  Length: 10.1 cm.  Normal morphology without mass or hydronephrosis.  Left Kidney:  Length: 10.7 cm. Normal cortical thickness and echogenicity. Tiny exophytic nodule at inferior pole, hypoechoic, containing a few nonspecific low level internal echoes, 10 x 9 x 10 mm. Kidney otherwise normal appearance. No additional mass, hydronephrosis or shadowing calcification.  Bladder:  Normal appearance  IMPRESSION: Tiny exophytic hypoechoic nodule at inferior pole LEFT kidney 10 x 9 x 10 mm, suspect either at a simple cyst containing artifacts or a minimally complicated cysts.  Followup ultrasound recommended in 6 months to demonstrate stability of this potentially mildly complicated lesion.  Kidneys otherwise unremarkable.   Electronically Signed   By: Ulyses Southward M.D.   On: 04/18/2014 16:01     EKG Interpretation   Date/Time:  Wednesday April 17 2014 00:37:56 EDT Ventricular Rate:  101 PR Interval:  174 QRS Duration: 86 QT Interval:  337 QTC Calculation: 437 R Axis:   69 Text Interpretation:  Sinus tachycardia Biatrial enlargement Unchanged EKG  Confirmed by Antoneo Ghrist, MD, Nida Manfredi ([id number]) on 04/17/2014 12:56:05 AM      MDM   Final diagnoses:  Cholecystitis    Pt with abd pain. Epigastric and RUQ. CT shows cholecysityis. Will admit.   Derwood Kaplan, MD 04/19/14 1059

## 2014-04-17 NOTE — Anesthesia Postprocedure Evaluation (Signed)
  Anesthesia Post-op Note  Patient: Tracy Hodges  Procedure(s) Performed: Procedure(s) (LRB): LAPAROSCOPIC CHOLECYSTECTOMY WITH INTRAOPERATIVE CHOLANGIOGRAM (N/A)  Patient Location: PACU  Anesthesia Type: General  Level of Consciousness: awake and alert   Airway and Oxygen Therapy: Patient Spontanous Breathing  Post-op Pain: mild  Post-op Assessment: Post-op Vital signs reviewed, Patient's Cardiovascular Status Stable, Respiratory Function Stable, Patent Airway and No signs of Nausea or vomiting  Last Vitals:  Filed Vitals:   04/17/14 1332  BP: 131/79  Pulse: 88  Temp: 36.9 C  Resp: 12    Post-op Vital Signs: stable   Complications: No apparent anesthesia complications

## 2014-04-17 NOTE — Transfer of Care (Signed)
Immediate Anesthesia Transfer of Care Note  Patient: Tracy MochaElizabeth A Hodges  Procedure(s) Performed: Procedure(s): LAPAROSCOPIC CHOLECYSTECTOMY WITH INTRAOPERATIVE CHOLANGIOGRAM (N/A)  Patient Location: PACU  Anesthesia Type:General  Level of Consciousness: awake, alert  and oriented  Airway & Oxygen Therapy: Patient Spontanous Breathing and Patient connected to face mask oxygen  Post-op Assessment: Report given to PACU RN and Post -op Vital signs reviewed and stable  Post vital signs: Reviewed and stable  Complications: No apparent anesthesia complications

## 2014-04-17 NOTE — Op Note (Signed)
04/17/2014  1:23 PM  PATIENT:  Tracy Hodges, 63 y.o., female, MRN: [id number]  PREOP DIAGNOSIS:  Cholecystitis  POSTOP DIAGNOSIS:   Acute edematous cholecystitis with adhesion of gall bladder to the 2nd portion of the duodenum  [photo in chart]  PROCEDURE:   Procedure(s):  LAPAROSCOPIC CHOLECYSTECTOMY WITH INTRAOPERATIVE CHOLANGIOGRAM  (5 port)  SURGEON:   Tracy Hodges, M.D.  ASSISTANT:   None  ANESTHESIA:   general  Anesthesiologist: Felipe Drone, MD CRNA: Thornell Mule, CRNA  General  ASA: 2#  EBL:  minimal  ml  BLOOD ADMINISTERED: none  DRAINS: none   LOCAL MEDICATIONS USED:   20 cc 1/4% marcaine  SPECIMEN:   Gall bladder  COUNTS CORRECT:  YES  INDICATIONS FOR PROCEDURE:  Tracy Hodges is a 63 y.o. (DOB: [birth year]) white  female whose primary care physician is Geraldo Pitter, MD and comes for cholecystectomy.   The indications and risks of the gall bladder surgery were explained to the patient.  The risks include, but are not limited to, infection, bleeding, common bile duct injury and open surgery.  SURGERY:  The patient was taken to room #11 at [facility].  The abdomen was prepped with chloroprep.  The patient was given 2 gm Ancef at the beginning of the operation.   A time out was held and the surgical checklist run.   An infraumbilical incision was made into the abdominal cavity.  A 12 mm Hasson trocar was inserted into the abdominal cavity through the infraumbilical incision and secured with a 0 Vicryl suture.  Four additional trocars were inserted: a 10 mm trocar in the sub-xiphoid location, a 5 mm trocar in the right mid subcostal area, a 5 mm trocar in the right lateral subcostal area, and 5 mm trocar mid way between the umbilicus.   The abdomen was explored and the liver, stomach, and bowel that could be seen were unremarkable.   The gall bladder edematous.  The second portion of the duodenal was stuck to the neck of the gall  bladder. The gall bladder was identified, grasped, and rotated cephalad. I decompressed the gall bladder and got out "white bile". Disssection was carried down to the gall bladder/cystic duct junction and the cystic duct isolated.  I had to mobilize the duodenum off the gall bladder  There was no evidence of a transmural injury to the duodenum.    A clip was placed on the gall bladder side of the cystic duct.   An intra-operative cholangiogram was shot.   The intra-operative cholangiogram was shot using a cut off Taut catheter placed through a 14 gauge angiocath in the RUQ.  The Taut catheter was inserted in the cut cystic duct and secured with an endoclip.  A cholangiogram was shot with 15 cc of 1/2 strength Omnipaque.  Using fluoroscopy, the cholangiogram showed the flow of contrast into the common bile duct, up the hepatic radicals, and into the duodenum.  There was no mass or obstruction.  This was a normal intra-operative cholangiogram.   The Taut catheter was removed.  The cystic duct was tripley endoclipped and the cystic artery was identified and clipped.  The gall bladder was bluntly and sharpley dissected from the gall bladder bed.   After the gall bladder was removed from the liver, the gall bladder bed and [place] were inspected.  There was no bleeding or bile leak.  The gall bladder was placed in a endocatch bag and delivered through the umbilicus.  The abdomen was irrigated with 1,500 cc saline.   The trocars were then removed.  I infiltrated 20 cc of 1/4% Marcaine into the incisions.  The umbilical port closed with a 0 Vicryl suture and the skin closed with 5-0 Monocryl.  The skin was painted with Dermabond.  The patient's sponge and needle count were correct.  The patient was transported to the RR in good condition.  Tracy Kinavid Agustus Mane, MD, [facility] Pager: [phone] Office phone:  [phone]

## 2014-04-17 NOTE — H&P (Signed)
Chief Complaint: abdominal pain  HPI: Tracy Hodges is a 63 year old female with a history of HTN, chronic low back pain, tobacco use(stopped 2 months ago) who presents for her second ED visit since Sunday with epigastric abdominal pain with radiation to the back.  Duration of symptoms is 4 days.  Onset was sudden.  Coarse is unchanged.  Time pattern is constant.  Characterized as a squeezing pain.  When seen in the ED, a cardiac work up was completed which was negative.  She was subsequently sent home with NSAIDs and hydrocodone which minimally relieved her pain.  No aggravating factors.  Modifying factors; as above.  Denies previous symptoms.  She denies shortness of breath, dizziness, nausea, diaphoresis.  She denies a past cardiac history.  She reports tobacco cessation approximately 2 months ago.  She denies drug use.  Her pain does not get worse with activity.  Denies use of blood thinners.  Denies previous abdominal surgeries.  Denies recent weight loss, diarrhea, nausea, vomiting, melena or hematochezia.  Her work up shows an increased alt/ast with a normal bilirubin and alk phos.  A white count of 15K, negative troponin x2 and an EKG without ST changes.  A CT of abdomen and pelvis revealed gallstones, edema, CBD dilatation without evidence of a stone.    Past Medical History  Diagnosis Date  . Hypertension     Past Surgical History  Procedure Laterality Date  . Back surgery      No family history on file. Social History:  reports that she has been smoking.  She does not have any smokeless tobacco history on file. She reports that she uses illicit drugs (Marijuana). She reports that she does not drink alcohol.  Allergies: No Known Allergies   (Not in a hospital admission)  Results for orders placed during the hospital encounter of 04/17/14 (from the past 48 hour(s))  CBC     Status: Abnormal   Collection Time    04/17/14  1:22 AM      Result Value Ref Range   WBC 15.3 (*) 4.0  - 10.5 K/uL   RBC 4.86  3.87 - 5.11 MIL/uL   Hemoglobin 15.5 (*) 12.0 - 15.0 g/dL   HCT 44.6  36.0 - 46.0 %   MCV 91.8  78.0 - 100.0 fL   MCH 31.9  26.0 - 34.0 pg   MCHC 34.8  30.0 - 36.0 g/dL   RDW 14.1  11.5 - 15.5 %   Platelets 273  150 - 400 K/uL  BASIC METABOLIC PANEL     Status: Abnormal   Collection Time    04/17/14  1:22 AM      Result Value Ref Range   Sodium 135 (*) 137 - 147 mEq/L   Potassium 3.9  3.7 - 5.3 mEq/L   Chloride 96  96 - 112 mEq/L   CO2 21  19 - 32 mEq/L   Glucose, Bld 99  70 - 99 mg/dL   BUN 13  6 - 23 mg/dL   Creatinine, Ser 0.72  0.50 - 1.10 mg/dL   Calcium 10.3  8.4 - 10.5 mg/dL   GFR calc non Af Amer 88 (*) >90 mL/min   GFR calc Af Amer >90  >90 mL/min   Comment: (NOTE)     The eGFR has been calculated using the CKD EPI equation.     This calculation has not been validated in all clinical situations.     eGFR's persistently <90 mL/min signify  possible Chronic Kidney     Disease.   Anion gap 18 (*) 5 - 15  TROPONIN I     Status: None   Collection Time    04/17/14  1:22 AM      Result Value Ref Range   Troponin I <0.30  <0.30 ng/mL   Comment:            Due to the release kinetics of cTnI,     a negative result within the first hours     of the onset of symptoms does not rule out     myocardial infarction with certainty.     If myocardial infarction is still suspected,     repeat the test at appropriate intervals.  HEPATIC FUNCTION PANEL     Status: Abnormal   Collection Time    04/17/14  1:22 AM      Result Value Ref Range   Total Protein 8.6 (*) 6.0 - 8.3 g/dL   Albumin 4.6  3.5 - 5.2 g/dL   AST 49 (*) 0 - 37 U/L   ALT 71 (*) 0 - 35 U/L   Alkaline Phosphatase 115  39 - 117 U/L   Total Bilirubin 0.3  0.3 - 1.2 mg/dL   Bilirubin, Direct <0.2  0.0 - 0.3 mg/dL   Indirect Bilirubin NOT CALCULATED  0.3 - 0.9 mg/dL  LIPASE, BLOOD     Status: None   Collection Time    04/17/14  1:22 AM      Result Value Ref Range   Lipase 25  11 - 59 U/L   I-STAT CG4 LACTIC ACID, ED     Status: None   Collection Time    04/17/14  5:47 AM      Result Value Ref Range   Lactic Acid, Venous 1.56  0.5 - 2.2 mmol/L  I-STAT TROPOININ, ED     Status: None   Collection Time    04/17/14  6:04 AM      Result Value Ref Range   Troponin i, poc 0.00  0.00 - 0.08 ng/mL   Comment 3            Comment: Due to the release kinetics of cTnI,     a negative result within the first hours     of the onset of symptoms does not rule out     myocardial infarction with certainty.     If myocardial infarction is still suspected,     repeat the test at appropriate intervals.  D-DIMER, QUANTITATIVE     Status: Abnormal   Collection Time    04/17/14  6:30 AM      Result Value Ref Range   D-Dimer, Quant 0.54 (*) 0.00 - 0.48 ug/mL-FEU   Comment:            AT THE INHOUSE ESTABLISHED CUTOFF     VALUE OF 0.48 ug/mL FEU,     THIS ASSAY HAS BEEN DOCUMENTED     IN THE LITERATURE TO HAVE     A SENSITIVITY AND NEGATIVE     PREDICTIVE VALUE OF AT LEAST     98 TO 99%.  THE TEST RESULT     SHOULD BE CORRELATED WITH     AN ASSESSMENT OF THE CLINICAL     PROBABILITY OF DVT / VTE.   Ct Abdomen Pelvis W Contrast  04/17/2014   CLINICAL DATA:  Upper abdominal pain.  Nausea and constipation.  EXAM: CT ABDOMEN AND PELVIS WITH  CONTRAST  TECHNIQUE: Multidetector CT imaging of the abdomen and pelvis was performed using the standard protocol following bolus administration of intravenous contrast.  CONTRAST:  66m OMNIPAQUE IOHEXOL 300 MG/ML SOLN, 1033mOMNIPAQUE IOHEXOL 300 MG/ML SOLN  COMPARISON:  None.  FINDINGS: Lower chest: The lung bases appear clear. No pleural effusion identified.  Hepatobiliary: There is no liver abnormality identified. The gallbladder wall is diffusely edematous. Stone within the neck of the gallbladder measures 1.3 x 2.0 cm. Fusiform dilatation of the common bile duct measures up to 1.1 cm proximally.  Pancreas: The pancreas appears within normal limits.   Spleen: Normal appearance of the spleen.  Adrenals/Urinary Tract: The adrenal glands are both within normal limits. Stone within the inferior pole the right kidney measures 4 mm, image 34/series 2. There is an exophytic cyst arising from the inferior pole of the left kidney measuring 1 cm and 22 Hounsfield units. The urinary bladder is normal.  Stomach/Bowel: Normal appearance of the stomach. The small bowel loops have a normal course and caliber. No obstruction. Normal appearance of the appendix. Multiple colonic diverticula identified without acute inflammation.  Vascular/Lymphatic: Calcified atherosclerotic disease involves the abdominal aorta. No aneurysm. No retroperitoneal adenopathy identified. There is no mesenteric adenopathy. No pelvic or inguinal adenopathy.  Reproductive: The uterus and the adnexal structures are unremarkable.  Other: No free fluid identified within the abdomen or pelvis.  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 This is most advanced at the L4-5 and L5-S1 level.  IMPRESSION: 1. Gallstones and gallbladder wall edema is concerning for acute cholecystitis. 2. Fusiform dilatation of the common bile duct. No choledocholithiasis identified. 3. Intermediate attenuating structure arising from the inferior pole of the left kidney. This is favored to represent a cyst. Suggest further evaluation with renal sonogram. 4. Atherosclerotic disease. 5. Lumbar spondylosis.   Electronically Signed   By: TaKerby Moors.D.   On: 04/17/2014 08:39   Dg Chest Port 1 View  04/17/2014   CLINICAL DATA:  Acute onset of mid chest pain, radiating to the back for several hours. Personal history of smoking. Initial encounter.  EXAM: PORTABLE CHEST - 1 VIEW  COMPARISON:  Chest radiograph performed 04/15/2014  FINDINGS: The lungs are well-aerated and clear. There is no evidence of focal opacification, pleural effusion or pneumothorax.  The cardiomediastinal silhouette is within  normal limits. No acute osseous abnormalities are seen.  IMPRESSION: No acute cardiopulmonary process seen.   Electronically Signed   By: JeGarald Balding.D.   On: 04/17/2014 05:51    Review of Systems  All other systems reviewed and are negative.   Blood pressure 149/68, pulse 100, temperature 98.7 F (37.1 C), temperature source Oral, resp. rate 11, SpO2 100.00%. Physical Exam  Constitutional: She is oriented to person, place, and time. She appears well-developed and well-nourished. No distress.  Neck: Normal range of motion. Neck supple.  Cardiovascular: Normal rate, regular rhythm, normal heart sounds and intact distal pulses.  Exam reveals no gallop and no friction rub.   No murmur heard. Respiratory: Effort normal and breath sounds normal.  GI: Soft. Bowel sounds are normal. She exhibits no distension and no mass. There is no rebound and no guarding.  TTP RUQ, moderate, just received morphine  Musculoskeletal: Normal range of motion.  Neurological: She is alert and oriented to person, place, and time.  Skin: Skin is warm and dry. No rash noted. She is not diaphoretic. No erythema. No pallor.  Psychiatric: She has a normal  mood and affect. Her behavior is normal. Judgment and thought content normal.     Assessment/Plan Cholelithiasis with acute cholecystitis  -will proceed with a cholecystectomy very shortly.  I thoroughly discussed surgical risks including but not limited to infection, bleeding, injury to surrounding structures, anesthesia risks.  She verbalizes understanding and wishes to proceed.  She has been NPO since last night. --VF -pain control -ancef on call to [place] op lovenox unless otherwise contraindicated -home meds, along with nicotine patch   Attenuating structure of left kidney -outpatient follow up with PCP for a renal US   [place], [place] ANP-BC 04/17/2014, 10:14 AM   Agree with above. Her PCP is Dr. Clayburn Pert. Her son, Norris Cross, and friend,  Carollee Sires, are at the bedside.  She works at at [employer].  She quit smoking about 3 months ago.  I gave her copies of her CT scan which discussed the left lower pole kidney cyst which needs a follow up ultrasound   I discussed with the patient the indications and risks of gall bladder surgery.  The primary risks of gall bladder surgery include, but are not limited to, bleeding, infection, common bile duct injury, and open surgery.  There is also the risk that the patient may have continued symptoms after surgery.  However, the likelihood of improvement in symptoms and return to the patient's normal status is good. We discussed the typical post-operative recovery course. I tried to answer the patient's questions.  I gave the patient literature about gall bladder surgery. Alphonsa Overall, MD, [facility] Pager: [phone] Office phone:  [phone]

## 2014-04-17 NOTE — Anesthesia Preprocedure Evaluation (Addendum)
Anesthesia Evaluation  Patient identified by MRN, date of birth, ID band Patient awake    Reviewed: Allergy & Precautions, H&P , NPO status , Patient's Chart, lab work & pertinent test results  History of Anesthesia Complications Negative for: history of anesthetic complications  Airway Mallampati: II TM Distance: >3 FB Neck ROM: Full    Dental  (+) Partial Upper, Chipped, Missing, Dental Advisory Given,    Pulmonary former smoker,  breath sounds clear to auscultation  Pulmonary exam normal       Cardiovascular Exercise Tolerance: Good hypertension, Pt. on medications Rhythm:Regular Rate:Normal     Neuro/Psych negative neurological ROS  negative psych ROS   GI/Hepatic negative GI ROS, Neg liver ROS,   Endo/Other  negative endocrine ROS  Renal/GU negative Renal ROS  negative genitourinary   Musculoskeletal negative musculoskeletal ROS (+)   Abdominal   Peds negative pediatric ROS (+)  Hematology negative hematology ROS (+)   Anesthesia Other Findings   Reproductive/Obstetrics negative OB ROS                        Anesthesia Physical Anesthesia Plan  ASA: II and emergent  Anesthesia Plan: General   Post-op Pain Management:    Induction: Intravenous, Rapid sequence and Cricoid pressure planned  Airway Management Planned: Oral ETT  Additional Equipment:   Intra-op Plan:   Post-operative Plan: Extubation in OR  Informed Consent: I have reviewed the patients History and Physical, chart, labs and discussed the procedure including the risks, benefits and alternatives for the proposed anesthesia with the patient or authorized representative who has indicated his/her understanding and acceptance.   Dental advisory given  Plan Discussed with: CRNA  Anesthesia Plan Comments:        Anesthesia Quick Evaluation

## 2014-04-17 NOTE — ED Notes (Signed)
Pt c/o central chest pressure, pain in abdomen and back. Pt sts she feels like a cannon ball is sitting on chest and back. Pt c/o Nausea, No vomiting. Pt denies SOB, lightheadedness, dizziness. Pt A&Ox4. Pt was seen yesterday for the same symptoms and was discharged. Pt returning because the "pain has overtaken" her torso. 8/10 pain score.

## 2014-04-18 ENCOUNTER — Inpatient Hospital Stay (HOSPITAL_COMMUNITY): Payer: Medicare HMO

## 2014-04-18 ENCOUNTER — Encounter (HOSPITAL_COMMUNITY): Payer: Self-pay | Admitting: Surgery

## 2014-04-18 DIAGNOSIS — K8000 Calculus of gallbladder with acute cholecystitis without obstruction: Secondary | ICD-10-CM | POA: Diagnosis not present

## 2014-04-18 LAB — COMPREHENSIVE METABOLIC PANEL
ALT: 110 U/L — ABNORMAL HIGH (ref 0–35)
AST: 87 U/L — ABNORMAL HIGH (ref 0–37)
Albumin: 3 g/dL — ABNORMAL LOW (ref 3.5–5.2)
Alkaline Phosphatase: 97 U/L (ref 39–117)
Anion gap: 9 (ref 5–15)
BILIRUBIN TOTAL: 0.3 mg/dL (ref 0.3–1.2)
BUN: 8 mg/dL (ref 6–23)
CHLORIDE: 103 meq/L (ref 96–112)
CO2: 26 mEq/L (ref 19–32)
Calcium: 8.8 mg/dL (ref 8.4–10.5)
Creatinine, Ser: 0.62 mg/dL (ref 0.50–1.10)
GFR calc Af Amer: >90 mL/min (ref >90)
GFR calc non Af Amer: >90 mL/min (ref >90)
Glucose, Bld: 123 mg/dL — ABNORMAL HIGH (ref 70–99)
Potassium: 4.4 mEq/L (ref 3.7–5.3)
Sodium: 138 mEq/L (ref 137–147)
TOTAL PROTEIN: 6.1 g/dL (ref 6.0–8.3)

## 2014-04-18 NOTE — Progress Notes (Signed)
1 Day Post-Op  Subjective: She is allot more sore this AM than yesterday.  Sites look fine.  She needs her Wellbutrin and patch.  I am going to have her try the pain meds, she has not had any this AM.  Advance her diet..  Objective: Vital signs in last 24 hours: Temp:  [97.6 F (36.4 C)-99.6 F (37.6 C)] 98.2 F (36.8 C) (10/08 0612) Pulse Rate:  [79-90] 79 (10/08 0612) Resp:  [12-18] 18 (10/08 0612) BP: (100-153)/(59-79) 115/60 mmHg (10/08 0612) SpO2:  [97 %-100 %] 99 % (10/08 0612) Weight:  [70.308 kg (155 lb)] 70.308 kg (155 lb) (10/07 1443)  960 PO Afebrile, VSS Labs OK  IOC was negative Intake/Output from previous day: 10/07 0701 - 10/08 0700 In: 3160 [P.O.:960; I.V.:2200] Out: 3310 [Urine:3300; Blood:10] Intake/Output this shift:    General appearance: alert, cooperative and no distress GI: soft very sore, + BS, sites look fine.  Lab Results:   Recent Labs  04/17/14 0122  WBC 15.3*  HGB 15.5*  HCT 44.6  PLT 273    BMET  Recent Labs  04/17/14 0122 04/18/14 0424  NA 135* 138  K 3.9 4.4  CL 96 103  CO2 21 26  GLUCOSE 99 123*  BUN 13 8  CREATININE 0.72 0.62  CALCIUM 10.3 8.8   PT/INR No results found for this basename: LABPROT, INR,  in the last 72 hours   Recent Labs Lab 04/17/14 0122 04/18/14 0424  AST 49* 87*  ALT 71* 110*  ALKPHOS 115 97  BILITOT 0.3 0.3  PROT 8.6* 6.1  ALBUMIN 4.6 3.0*     Lipase     Component Value Date/Time   LIPASE 25 04/17/2014 0122     Studies/Results: Dg Cholangiogram Operative  04/17/2014   CLINICAL DATA:  Intraoperative cholangiogram during laparoscopic cholecystectomy. Evaluate for patency of the common bile duct. Initial encounter appear  EXAM: INTRAOPERATIVE CHOLANGIOGRAM  FLUOROSCOPY TIME:  14 seconds  COMPARISON:  CT abdomen pelvis -04/17/2014  FINDINGS: Intraoperative angiographic images of the right upper abdominal quadrant during laparoscopic cholecystectomy are provided for review.  Surgical clips  overlie the expected location of the gallbladder fossa.  Contrast injection demonstrates selective cannulation of the central aspect of the cystic duct.  There is passage of contrast through the central aspect of the cystic duct with filling of a non dilated common bile duct. There is passage of contrast though the CBD and into the descending portion of the duodenum.  There is minimal reflux of injected contrast into the common hepatic duct and central aspect of the non dilated intrahepatic biliary system.  There are no discrete filling defects within the opacified portions of the biliary system to suggest the presence of choledocholithiasis.  IMPRESSION: No evidence of choledocholithiasis.   Electronically Signed   By: Simonne Come M.D.   On: 04/17/2014 12:51   Ct Abdomen Pelvis W Contrast  04/17/2014   CLINICAL DATA:  Upper abdominal pain.  Nausea and constipation.  EXAM: CT ABDOMEN AND PELVIS WITH CONTRAST  TECHNIQUE: Multidetector CT imaging of the abdomen and pelvis was performed using the standard protocol following bolus administration of intravenous contrast.  CONTRAST:  50mL OMNIPAQUE IOHEXOL 300 MG/ML SOLN, OMNIPAQUE IOHEXOL 300 MG/ML SOLN  COMPARISON:  None.  FINDINGS: Lower chest: The lung bases appear clear. No pleural effusion identified.  Hepatobiliary: There is no liver abnormality identified. The gallbladder wall is diffusely edematous. Stone within the neck of the gallbladder measures 1.3 x 2.0 cm.  Fusiform dilatation of the common bile duct measures up to 1.1 cm proximally.  Pancreas: The pancreas appears within normal limits.  Spleen: Normal appearance of the spleen.  Adrenals/Urinary Tract: The adrenal glands are both within normal limits. Stone within the inferior pole the right kidney measures 4 mm, image 34/series 2. There is an exophytic cyst arising from the inferior pole of the left kidney measuring 1 cm and 22 Hounsfield units. The urinary bladder is normal.  Stomach/Bowel: Normal  appearance of the stomach. The small bowel loops have a normal course and caliber. No obstruction. Normal appearance of the appendix. Multiple colonic diverticula identified without acute inflammation.  Vascular/Lymphatic: Calcified atherosclerotic disease involves the abdominal aorta. No aneurysm. No retroperitoneal adenopathy identified. There is no mesenteric adenopathy. No pelvic or inguinal adenopathy.  Reproductive: The uterus and the adnexal structures are unremarkable.  Other: No free fluid identified within the abdomen or pelvis.  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 This is most advanced at the L4-5 and L5-S1 level.  IMPRESSION: 1. Gallstones and gallbladder wall edema is concerning for acute cholecystitis. 2. Fusiform dilatation of the common bile duct. No choledocholithiasis identified. 3. Intermediate attenuating structure arising from the inferior pole of the left kidney. This is favored to represent a cyst. Suggest further evaluation with renal sonogram. 4. Atherosclerotic disease. 5. Lumbar spondylosis.   Electronically Signed   By: Signa Kellaylor  Stroud M.D.   On: 04/17/2014 08:39   Dg Chest Port 1 View  04/17/2014   CLINICAL DATA:  Acute onset of mid chest pain, radiating to the back for several hours. Personal history of smoking. Initial encounter.  EXAM: PORTABLE CHEST - 1 VIEW  COMPARISON:  Chest radiograph performed 04/15/2014  FINDINGS: The lungs are well-aerated and clear. There is no evidence of focal opacification, pleural effusion or pneumothorax.  The cardiomediastinal silhouette is within normal limits. No acute osseous abnormalities are seen.  IMPRESSION: No acute cardiopulmonary process seen.   Electronically Signed   By: Roanna RaiderJeffery  Chang M.D.   On: 04/17/2014 05:51    Medications: . buPROPion  150 mg Oral BID  .  ceFAZolin (ANCEF) IV  1 g Intravenous Q8H  . heparin subcutaneous  5,000 Units Subcutaneous 3 times per day  . Influenza vac split  quadrivalent PF  0.5 mL Intramuscular Tomorrow-1000  . lisinopril  5 mg Oral Daily  . multivitamin with minerals  1 tablet Oral Daily  . nicotine  21 mg Transdermal Daily  . pneumococcal 23 valent vaccine  0.5 mL Intramuscular Tomorrow-1000   . sodium chloride    . dextrose 5 % and 0.45 % NaCl with KCl 40 mEq/L 100 mL/hr at 04/18/14 0306    Prior to Admission medications   Medication Sig Start Date End Date Taking? Authorizing Provider  ALPRAZolam Prudy Feeler(XANAX) 1 MG tablet Take 1 mg by mouth 3 (three) times daily as needed for anxiety.   Yes Historical Provider, MD  Ascorbic Acid (VITAMIN C PO) Take 1 tablet by mouth 2 (two) times daily.   Yes Historical Provider, MD  aspirin 325 MG tablet Take 325 mg by mouth daily as needed (back pain).   Yes Historical Provider, MD  buPROPion (WELLBUTRIN SR) 150 MG 12 hr tablet Take 150 mg by mouth 2 (two) times daily.   Yes Historical Provider, MD  Calcium Carb-Cholecalciferol (CALCIUM 1000 + D PO) Take 1 tablet by mouth daily.   Yes Historical Provider, MD  Cholecalciferol (VITAMIN D PO) Take 1 tablet by  mouth 2 (two) times daily.   Yes Historical Provider, MD  diphenhydrAMINE (BENADRYL) 25 mg capsule Take 25 mg by mouth every 6 (six) hours as needed for itching or allergies.   Yes Historical Provider, MD  HYDROcodone-acetaminophen (NORCO) 7.5-325 MG per tablet Take 1 tablet by mouth every 6 (six) hours as needed for moderate pain.   Yes Historical Provider, MD  ibuprofen (ADVIL,MOTRIN) 800 MG tablet Take 1 tablet (800 mg total) by mouth every 8 (eight) hours as needed for mild pain or moderate pain. 04/15/14  Yes Trixie Dredge, PA-C  lisinopril (PRINIVIL,ZESTRIL) 5 MG tablet Take 5 mg by mouth daily.   Yes Historical Provider, MD  Multiple Vitamin (MULTIVITAMIN WITH MINERALS) TABS tablet Take 1 tablet by mouth daily.   Yes Historical Provider, MD  nicotine (NICODERM CQ - DOSED IN MG/24 HOURS) 21 mg/24hr patch Place 21 mg onto the skin daily.   Yes Historical  Provider, MD    Assessment/Plan 1.  Acute edematous cholecystitis with adhesion of gall bladder to the 2nd portion of the duodenum [photo in chart]  S/p  LAPAROSCOPIC CHOLECYSTECTOMY WITH INTRAOPERATIVE CHOLANGIOGRAM,  04/17/2014, Ovidio Kin, MD  2.  Hypertension 3.  Tobacco use 4.  Intermediate attenuating structure arising from the inferior pole of the left kidney. This is favored to represent a cyst. Suggest further evaluation with renal sonogram.   Plan:  Restart PO wellbutrin, and patch, increase diet and if she does well home later today. She does not feel like she is ready to go home today. 1:52 PM.    LOS: 1 day    JENNINGS,WILLARD 04/18/2014  Agree with above. Tolerating reg diet.  Will plan to keep till tomorrow. Her CT scan showed a probable cyst of left lower pole - will plan [place] tomorrow AM before discharge.  Ovidio Kin, MD, [facility] Pager: [phone] Office phone:  [phone]

## 2014-04-18 NOTE — Progress Notes (Signed)
INITIAL NUTRITION ASSESSMENT  DOCUMENTATION CODES Per approved criteria  -Not Applicable   INTERVENTION: - Discussed low fat diet for cholecystectomy and answered family's questions about fat content of nutritional supplements - RD to continue to monitor   NUTRITION DIAGNOSIS: Food and nutrition knowledge related deficit related to diet for cholecystectomy as evidenced by pt report.   Goal: Pt to verbalize understand of reasoning behind and importance of following low fat diet - met   Reason for Assessment: Malnutrition screening tool   63 y.o. female  Admitting Dx: Abdominal pain   ASSESSMENT: Pt with history of HTN, chronic low back pain, tobacco use(stopped 2 months ago) who presents for her second ED visit since Sunday with epigastric abdominal pain with radiation to the back. Duration of symptoms is 4 days. Found to have cholelithiasis with acute cholecystitis.   - Had cholecystectomy yesterday - Met with pt and son - Pt reports she didn't eat anything for 2-3 days PTA due to not having an appetite - Reports before then she would eat 2 meals/day of things like chicken and rice and other balanced meals - Denies any weight loss - C/o dry heaves before surgery yesterday but states it resolved - Reports tolerating full liquid diet well this morning and ate >50% of meal - Denies any nausea  - Performed nutrition focused physical exam which was WNL except for some mild fat loss in orbital region   AST/ATL elevated    Height: Ht Readings from Last 1 Encounters:  04/17/14 5' 1"  (1.549 m)    Weight: Wt Readings from Last 1 Encounters:  04/17/14 155 lb (70.308 kg)    Ideal Body Weight: 105 lbs  % Ideal Body Weight: 148%  Wt Readings from Last 10 Encounters:  04/17/14 155 lb (70.308 kg)  04/17/14 155 lb (70.308 kg)  04/15/14 155 lb (70.308 kg)    Usual Body Weight: 155 lbs per pt  % Usual Body Weight: 100%  BMI:  Body mass index is 29.3 kg/(m^2).  Estimated  Nutritional Needs: Kcal: 1300-1500 Protein: 55-70g Fluid: 1.3-1.5L/day   Skin: surgical abdominal incision   Diet Order: Cardiac  EDUCATION NEEDS: -Education needs addressed - reviewed low fat diet for cholecystectomy    Intake/Output Summary (Last 24 hours) at 04/18/14 1033 Last data filed at 04/18/14 0956  Gross per 24 hour  Intake   3400 ml  Output   3810 ml  Net   -410 ml    Last BM: PTA  Labs:   Recent Labs Lab 04/15/14 0400 04/17/14 0122 04/18/14 0424  NA 140 135* 138  K 3.7 3.9 4.4  CL 102 96 103  CO2 25 21 26   BUN 15 13 8   CREATININE 0.78 0.72 0.62  CALCIUM 9.7 10.3 8.8  GLUCOSE 92 99 123*    CBG (last 3)  No results found for this basename: GLUCAP,  in the last 72 hours  Scheduled Meds: . buPROPion  150 mg Oral BID  .  ceFAZolin (ANCEF) IV  1 g Intravenous Q8H  . heparin subcutaneous  5,000 Units Subcutaneous 3 times per day  . lisinopril  5 mg Oral Daily  . multivitamin with minerals  1 tablet Oral Daily  . nicotine  21 mg Transdermal Daily    Continuous Infusions: . dextrose 5 % and 0.45 % NaCl with KCl 40 mEq/L 100 mL/hr at 04/18/14 0306    Past Medical History  Diagnosis Date  . Hypertension     Past Surgical History  Procedure  Laterality Date  . Back surgery      Carlis Stable MS, [place], [place] Pager [phone] Weekend/After Hours Pager

## 2014-04-19 ENCOUNTER — Encounter (HOSPITAL_COMMUNITY): Payer: Self-pay | Admitting: General Surgery

## 2014-04-19 DIAGNOSIS — N281 Cyst of kidney, acquired: Secondary | ICD-10-CM

## 2014-04-19 DIAGNOSIS — Z87891 Personal history of nicotine dependence: Secondary | ICD-10-CM

## 2014-04-19 DIAGNOSIS — I10 Essential (primary) hypertension: Secondary | ICD-10-CM

## 2014-04-19 HISTORY — DX: Cyst of kidney, acquired: N28.1

## 2014-04-19 HISTORY — DX: Essential (primary) hypertension: I10

## 2014-04-19 HISTORY — DX: Personal history of nicotine dependence: Z87.891

## 2014-04-19 LAB — BASIC METABOLIC PANEL
Anion gap: 10 (ref 5–15)
BUN: 10 mg/dL (ref 6–23)
CO2: 25 meq/L (ref 19–32)
CREATININE: 0.68 mg/dL (ref 0.50–1.10)
Calcium: 8.6 mg/dL (ref 8.4–10.5)
Chloride: 105 mEq/L (ref 96–112)
GFR calc Af Amer: >90 mL/min (ref >90)
GFR calc non Af Amer: 90 mL/min — ABNORMAL LOW (ref >90)
GLUCOSE: 111 mg/dL — AB (ref 70–99)
Potassium: 4.3 mEq/L (ref 3.7–5.3)
SODIUM: 140 meq/L (ref 137–147)

## 2014-04-19 LAB — CBC
HEMATOCRIT: 33.1 % — AB (ref 36.0–46.0)
HEMOGLOBIN: 11 g/dL — AB (ref 12.0–15.0)
MCH: 30.8 pg (ref 26.0–34.0)
MCHC: 33.2 g/dL (ref 30.0–36.0)
MCV: 92.7 fL (ref 78.0–100.0)
Platelets: 264 10*3/uL (ref 150–400)
RBC: 3.57 MIL/uL — AB (ref 3.87–5.11)
RDW: 14.6 % (ref 11.5–15.5)
WBC: 8.7 10*3/uL (ref 4.0–10.5)

## 2014-04-19 MED ORDER — HYDROCODONE-ACETAMINOPHEN 5-325 MG PO TABS: 1.0000 | ORAL_TABLET | ORAL | Status: AC | PRN

## 2014-04-19 MED ORDER — ACETAMINOPHEN 325 MG PO TABS: 650.0000 mg | ORAL_TABLET | Freq: Four times a day (QID) | ORAL | Status: AC | PRN

## 2014-04-19 MED ORDER — IBUPROFEN 200 MG PO TABS: ORAL_TABLET | ORAL | Status: AC

## 2014-04-19 NOTE — Discharge Instructions (Signed)
Laparoscopic Cholecystectomy, Care After °Refer to this sheet in the next few weeks. These instructions provide you with information on caring for yourself after your procedure. Your health care provider may also give you more specific instructions. Your treatment has been planned according to current medical practices, but problems sometimes occur. Call your health care provider if you have any problems or questions after your procedure. °WHAT TO EXPECT AFTER THE PROCEDURE °After your procedure, it is typical to have the following: °· Pain at your incision sites. You will be given pain medicines to control the pain. °· Mild nausea or vomiting. This should improve after the first 24 hours. °· Bloating and possibly shoulder pain from the gas used during the procedure. This will improve after the first 24 hours. °HOME CARE INSTRUCTIONS  °· Change bandages (dressings) as directed by your health care provider. °· Keep the wound dry and clean. You may wash the wound gently with soap and water. Gently blot or dab the area dry. °· Do not take baths or use swimming pools or hot tubs for 2 weeks or until your health care provider approves. °· Only take over-the-counter or prescription medicines as directed by your health care provider. °· Continue your normal diet as directed by your health care provider. °· Do not lift anything heavier than 10 pounds (4.5 kg) until your health care provider approves. °· Do not play contact sports for 1 week or until your health care provider approves. °SEEK MEDICAL CARE IF:  °· You have redness, swelling, or increasing pain in the wound. °· You notice yellowish-white fluid (pus) coming from the wound. °· You have drainage from the wound that lasts longer than 1 day. °· You notice a bad smell coming from the wound or dressing. °· Your surgical cuts (incisions) break open. °SEEK IMMEDIATE MEDICAL CARE IF:  °· You develop a rash. °· You have difficulty breathing. °· You have chest pain. °· You  have a fever. °· You have increasing pain in the shoulders (shoulder strap areas). °· You have dizzy episodes or faint while standing. °· You have severe abdominal pain. °· You feel sick to your stomach (nauseous) or throw up (vomit) and this lasts for more than 1 day. °Document Released: 06/28/2005 Document Revised: 04/18/2013 Document Reviewed: 02/07/2013 °ExitCare® Patient Information ©2015 [employer]. This information is not intended to replace advice given to you by your health care provider. Make sure you discuss any questions you have with your health care provider. ° °[facility] ______[facility] °LAPAROSCOPIC SURGERY: POST OP INSTRUCTIONS °Always review your discharge instruction sheet given to you by the facility where your surgery was performed. °IF YOU HAVE DISABILITY OR FAMILY LEAVE FORMS, YOU MUST BRING THEM TO THE OFFICE FOR PROCESSING.   °DO NOT GIVE THEM TO YOUR DOCTOR. ° °1. A prescription for pain medication may be given to you upon discharge.  Take your pain medication as prescribed, if needed.  If narcotic pain medicine is not needed, then you may take acetaminophen (Tylenol) or ibuprofen (Advil) as needed. °2. Take your usually prescribed medications unless otherwise directed. °3. If you need a refill on your pain medication, please contact your pharmacy.  They will contact our office to request authorization. Prescriptions will not be filled after 5pm or on week-ends. °4. You should follow a light diet the first few days after arrival home, such as soup and crackers, etc.  Be sure to include lots of fluids daily. °5. Most patients will experience some   swelling and bruising in the area of the incisions.  Ice packs will help.  Swelling and bruising can take several days to resolve.  °6. It is common to experience some constipation if taking pain medication after surgery.  Increasing fluid intake and taking a stool softener (such as Colace) will usually help or prevent this problem  from occurring.  A mild laxative (Milk of Magnesia or Miralax) should be taken according to package instructions if there are no bowel movements after 48 hours. °7. Unless discharge instructions indicate otherwise, you may remove your bandages 24-48 hours after surgery, and you may shower at that time.  You may have steri-strips (small skin tapes) in place directly over the incision.  These strips should be left on the skin for 7-10 days.  If your surgeon used skin glue on the incision, you may shower in 24 hours.  The glue will flake off over the next 2-3 weeks.  Any sutures or staples will be removed at the office during your follow-up visit. °8. ACTIVITIES:  You may resume regular (light) daily activities beginning the next day--such as daily self-care, walking, climbing stairs--gradually increasing activities as tolerated.  You may have sexual intercourse when it is comfortable.  Refrain from any heavy lifting or straining until approved by your doctor. °a. You may drive when you are no longer taking prescription pain medication, you can comfortably wear a seatbelt, and you can safely maneuver your car and apply brakes. °b. RETURN TO WORK:  __________________________________________________________ °9. You should see your doctor in the office for a follow-up appointment approximately 2-3 weeks after your surgery.  Make sure that you call for this appointment within a day or two after you arrive home to insure a convenient appointment time. °10. OTHER INSTRUCTIONS: __________________________________________________________________________________________________________________________ __________________________________________________________________________________________________________________________ °WHEN TO CALL YOUR DOCTOR: °1. Fever over 101.0 °2. Inability to urinate °3. Continued bleeding from incision. °4. Increased pain, redness, or drainage from the incision. °5. Increasing abdominal pain ° °The  clinic staff is available to answer your questions during regular business hours.  Please don’t hesitate to call and ask to speak to one of the nurses for clinical concerns.  If you have a medical emergency, go to the nearest emergency room or call 911.  A surgeon from [facility] is always on call at the hospital. °[address], [address], [place], [place]  [address] ? [address] [address], [place], [place]   [address] °[phone] ? [phone] ? FAX [phone] °Web site: [email or web address] °

## 2014-04-19 NOTE — Progress Notes (Signed)
2 Days Post-Op  Subjective: She feels better, tolerating a diet, she is finding she feels better with pain med and it improves her BP also.  The renal US shows a cyst.  I have told her to talk it over with Dr. Parke SimmersBland her PCP and she can decide on Urology consult as an OP. Objective: Vital signs in last 24 hours: Temp:  [98.1 F (36.7 C)-98.2 F (36.8 C)] 98.1 F (36.7 C) (10/09 0546) Pulse Rate:  [84-89] 84 (10/09 0546) Resp:  [16-18] 16 (10/09 0546) BP: (114-130)/(60-72) 114/66 mmHg (10/09 0546) SpO2:  [96 %-99 %] 96 % (10/09 0546) Last BM Date: 04/17/14 (PTA) 600 PO  Cardiac diet Afebrile, VSS Labs are fine Renal US:  Tiny exophytic hypoechoic nodule at inferior pole LEFT kidney 10 x 9 x 10 mm, suspect either at a simple cyst containing artifacts or a minimally complicated cysts.   Intake/Output from previous day: 10/08 0701 - 10/09 0700 In: 2926.7 [P.O.:600; I.V.:2276.7; IV Piggyback:50] Out: 2950 [Urine:2950] Intake/Output this shift: Total I/O In: 360 [P.O.:360] Out: -   General appearance: alert, cooperative and no distress Resp: clear to auscultation bilaterally GI: soft sore, sites all look good, + flatus  Lab Results:   Recent Labs  04/17/14 0122 04/19/14 0522  WBC 15.3* 8.7  HGB 15.5* 11.0*  HCT 44.6 33.1*  PLT 273 264    BMET  Recent Labs  04/18/14 0424 04/19/14 0522  NA 138 140  K 4.4 4.3  CL 103 105  CO2 26 25  GLUCOSE 123* 111*  BUN 8 10  CREATININE 0.62 0.68  CALCIUM 8.8 8.6   PT/INR No results found for this basename: LABPROT, INR,  in the last 72 hours   Recent Labs Lab 04/17/14 0122 04/18/14 0424  AST 49* 87*  ALT 71* 110*  ALKPHOS 115 97  BILITOT 0.3 0.3  PROT 8.6* 6.1  ALBUMIN 4.6 3.0*     Lipase     Component Value Date/Time   LIPASE 25 04/17/2014 0122     Studies/Results: Dg Cholangiogram Operative  04/17/2014   CLINICAL DATA:  Intraoperative cholangiogram during laparoscopic cholecystectomy. Evaluate for  patency of the common bile duct. Initial encounter appear  EXAM: INTRAOPERATIVE CHOLANGIOGRAM  FLUOROSCOPY TIME:  14 seconds  COMPARISON:  CT abdomen pelvis -04/17/2014  FINDINGS: Intraoperative angiographic images of the right upper abdominal quadrant during laparoscopic cholecystectomy are provided for review.  Surgical clips overlie the expected location of the gallbladder fossa.  Contrast injection demonstrates selective cannulation of the central aspect of the cystic duct.  There is passage of contrast through the central aspect of the cystic duct with filling of a non dilated common bile duct. There is passage of contrast though the CBD and into the descending portion of the duodenum.  There is minimal reflux of injected contrast into the common hepatic duct and central aspect of the non dilated intrahepatic biliary system.  There are no discrete filling defects within the opacified portions of the biliary system to suggest the presence of choledocholithiasis.  IMPRESSION: No evidence of choledocholithiasis.   Electronically Signed   By: Simonne ComeJohn  Watts M.D.   On: 04/17/2014 12:51   [place] Renal  04/18/2014   CLINICAL DATA:  Abnormal CT exam demonstrating a lesion arising from the inferior pole of the LEFT kidney  EXAM: RENAL/URINARY TRACT ULTRASOUND COMPLETE  COMPARISON:  CT abdomen and pelvis 04/17/2014  FINDINGS: Right Kidney:  Length: 10.1 cm.  Normal morphology without mass or hydronephrosis.  Left  Kidney:  Length: 10.7 cm. Normal cortical thickness and echogenicity. Tiny exophytic nodule at inferior pole, hypoechoic, containing a few nonspecific low level internal echoes, 10 x 9 x 10 mm. Kidney otherwise normal appearance. No additional mass, hydronephrosis or shadowing calcification.  Bladder:  Normal appearance  IMPRESSION: Tiny exophytic hypoechoic nodule at inferior pole LEFT kidney 10 x 9 x 10 mm, suspect either at a simple cyst containing artifacts or a minimally complicated cysts.  Followup ultrasound  recommended in 6 months to demonstrate stability of this potentially mildly complicated lesion.  Kidneys otherwise unremarkable.   Electronically Signed   By: Ulyses SouthwardMark  Boles M.D.   On: 04/18/2014 16:01    Medications: . buPROPion  150 mg Oral BID  .  ceFAZolin (ANCEF) IV  1 g Intravenous Q8H  . heparin subcutaneous  5,000 Units Subcutaneous 3 times per day  . lisinopril  5 mg Oral Daily  . multivitamin with minerals  1 tablet Oral Daily  . nicotine  21 mg Transdermal Daily    Assessment/Plan . Acute edematous cholecystitis with adhesion of gall bladder to the 2nd portion of the duodenum [photo in chart]  S/p LAPAROSCOPIC CHOLECYSTECTOMY WITH INTRAOPERATIVE CHOLANGIOGRAM, 04/17/2014, Ovidio Kinavid Avenir Lozinski, MD  2. Hypertension  3. Tobacco use  4. Intermediate attenuating structure arising from the inferior pole of the left kidney. This is favored to represent a cyst. Suggest further evaluation with renal sonogram.    Plan;  Home today and follow up in 2-3 weeks D[facility] clinic.  Follow up with Dr. Parke SimmersBland about left renal cyst.   LOS: 2 days    JENNINGS,WILLARD 04/19/2014  Agree with above. US showed the left inferior pole mass to be a cyst, though they did recommend 6 month follow up.  Ovidio Kinavid Tressia Labrum, MD, [facility] Pager: [phone] Office phone:  [phone]

## 2014-04-19 NOTE — Discharge Summary (Signed)
Physician Discharge Summary  Patient ID: Tracy Hodges MRN: [id number] DOB/AGE: [birth year] 63 y.o.  Admit date: 04/17/2014 Discharge date: 04/19/2014  Admission Diagnoses:  1. Acute edematous cholecystitis with adhesion of gall bladder to the 2nd portion of the duodenum [photo in chart]  2. Hypertension  3. Tobacco use   Discharge Diagnoses:  1. Acute edematous cholecystitis with adhesion of gall bladder to the 2nd portion of the duodenum [photo in chart]  2. Hypertension  3. Tobacco use  4.  LEFT kidney 10 x 9 x 10 mm, suspect either at a simple cyst containing  artifacts or a minimally complicated cysts.  US showed this to be a cyst, though there was a recommendation of a 6 month follow up.   Active Problems:   Acute calculous cholecystitis   Cholecystitis with cholelithiasis   Essential hypertension   Hx of tobacco use, presenting hazards to health   Renal cyst, left   PROCEDURES: S/p LAPAROSCOPIC CHOLECYSTECTOMY WITH INTRAOPERATIVE CHOLANGIOGRAM, 04/17/2014, Alphonsa Overall, MD    Hospital Course: Tracy Hodges is a 63 year old female with a history of HTN, chronic low back pain, tobacco use(stopped 2 months ago) who presents for her second ED visit since Sunday with epigastric abdominal pain with radiation to the back. Duration of symptoms is 4 days. Onset was sudden. Coarse is unchanged. Time pattern is constant. Characterized as a squeezing pain. When seen in the ED, a cardiac work up was completed which was negative. She was subsequently sent home with NSAIDs and hydrocodone which minimally relieved her pain. No aggravating factors. Modifying factors; as above. Denies previous symptoms. She denies shortness of breath, dizziness, nausea, diaphoresis. She denies a past cardiac history. She reports tobacco cessation approximately 2 months ago. She denies drug use. Her pain does not get worse with activity. Denies use of blood thinners. Denies previous abdominal surgeries. Denies  recent weight loss, diarrhea, nausea, vomiting, melena or hematochezia. Her work up shows an increased alt/ast with a normal bilirubin and alk phos. A white count of 15K, negative troponin x2 and an EKG without ST changes. A CT of abdomen and pelvis revealed gallstones, edema, CBD dilatation without evidence of a stone.  She was admitted and underwent surgery on 04/17/14.  She had the gallbladder attached to her duodenum.  She took 48 hours to improve enough to go home.  She had a renal ultrasound to help evaluate abnormal CT and it was radiology's opinion this is a cyst.  We will let her discuss with Dr. Criss Rosales and then refer to urology if they wish to monitor and for  further evaluation.  Her ports are healing nicely, she is tolerating a diet and is ready for discharge today.  Condition on d/c:  Improved    Disposition: 01-Home or Self Care     Medication List    STOP taking these medications       HYDROcodone-acetaminophen 7.5-325 MG per tablet  Commonly known as:  NORCO  Replaced by:  HYDROcodone-acetaminophen 5-325 MG per tablet      TAKE these medications       acetaminophen 325 MG tablet  Commonly known as:  TYLENOL  Take 2 tablets (650 mg total) by mouth every 6 (six) hours as needed for mild pain (or Temp > 100).     ALPRAZolam 1 MG tablet  Commonly known as:  XANAX  Take 1 mg by mouth 3 (three) times daily as needed for anxiety.     aspirin 325 MG tablet  Take 325 mg by mouth daily as needed (back pain).     buPROPion 150 MG 12 hr tablet  Commonly known as:  WELLBUTRIN SR  Take 150 mg by mouth 2 (two) times daily.     CALCIUM 1000 + D PO  Take 1 tablet by mouth daily.     diphenhydrAMINE 25 mg capsule  Commonly known as:  BENADRYL  Take 25 mg by mouth every 6 (six) hours as needed for itching or allergies.     HYDROcodone-acetaminophen 5-325 MG per tablet  Commonly known as:  NORCO/VICODIN  Take 1-2 tablets by mouth every 4 (four) hours as needed for moderate  pain.     ibuprofen 200 MG tablet  Commonly known as:  ADVIL,MOTRIN  You can take 2-3 every 6 hours for pain as needed.  To much of this is hard on your GI tract and kidneys.     lisinopril 5 MG tablet  Commonly known as:  PRINIVIL,ZESTRIL  Take 5 mg by mouth daily.     multivitamin with minerals Tabs tablet  Take 1 tablet by mouth daily.     nicotine 21 mg/24hr patch  Commonly known as:  NICODERM CQ - dosed in mg/24 hours  Place 21 mg onto the skin daily.     VITAMIN C PO  Take 1 tablet by mouth 2 (two) times daily.     VITAMIN D PO  Take 1 tablet by mouth 2 (two) times daily.       Follow-up Information   Call [facility]. (Office should call you, but if you don't hear by 04/23/14, call and arrange an appointment in 2-3 weeks.)    Contact information:   [address] [place] [address] [phone]       Follow up with Elyn Peers, MD. (Make a follow appointment and discuss left renal cyst/medical management issues.)    Specialty:  Family Medicine   Contact information:   [address] [place] [address] [phone]       Signed: Earnstine Regal 04/19/2014, 10:45 AM   Agree with above.  Alphonsa Overall, MD, [facility] Surgery Pager: [phone] Office phone:  [phone]

## 2014-04-19 NOTE — ED Provider Notes (Signed)
Medical screening examination/treatment/procedure(s) were performed by non-physician practitioner and as supervising physician I was immediately available for consultation/collaboration.   EKG Interpretation   Date/Time:  Monday April 15 2014 03:55:25 EDT Ventricular Rate:  80 PR Interval:  158 QRS Duration: 80 QT Interval:  386 QTC Calculation: 445 R Axis:   42 Text Interpretation:  Sinus rhythm with occasional Premature ventricular  complexes Possible Left atrial enlargement Borderline ECG ED PHYSICIAN  INTERPRETATION AVAILABLE IN [facility] Confirmed by TEST, Record  ([id number]) on 04/17/2014 6:54:20 AM       Raeford RazorStephen Amarionna Arca, MD 04/19/14 [id number]

## 2014-04-19 NOTE — Progress Notes (Signed)
Patient is alert and oriented, vital signs are stable, incisions are within normal limits, patient is tolerating diet without complaints of nausea or vomiting,prescriptions given and questions and concerns answered, [facility] Stanford BreedBracey, Jaggar Benko N RN 04-19-2014 13:03pm

## 2014-08-06 DIAGNOSIS — Z1211 Encounter for screening for malignant neoplasm of colon: Secondary | ICD-10-CM | POA: Diagnosis not present

## 2014-08-08 DIAGNOSIS — Z1231 Encounter for screening mammogram for malignant neoplasm of breast: Secondary | ICD-10-CM | POA: Diagnosis not present

## 2014-09-04 DIAGNOSIS — Z1211 Encounter for screening for malignant neoplasm of colon: Secondary | ICD-10-CM | POA: Diagnosis not present

## 2014-09-04 DIAGNOSIS — K5730 Diverticulosis of large intestine without perforation or abscess without bleeding: Secondary | ICD-10-CM | POA: Diagnosis not present

## 2014-10-11 DIAGNOSIS — F5101 Primary insomnia: Secondary | ICD-10-CM | POA: Diagnosis not present

## 2014-10-11 DIAGNOSIS — I10 Essential (primary) hypertension: Secondary | ICD-10-CM | POA: Diagnosis not present

## 2015-06-13 DIAGNOSIS — H9012 Conductive hearing loss, unilateral, left ear, with unrestricted hearing on the contralateral side: Secondary | ICD-10-CM | POA: Diagnosis not present

## 2015-06-13 DIAGNOSIS — H8093 Unspecified otosclerosis, bilateral: Secondary | ICD-10-CM | POA: Diagnosis not present

## 2015-06-13 DIAGNOSIS — H906 Mixed conductive and sensorineural hearing loss, bilateral: Secondary | ICD-10-CM | POA: Diagnosis not present

## 2015-06-13 DIAGNOSIS — H9041 Sensorineural hearing loss, unilateral, right ear, with unrestricted hearing on the contralateral side: Secondary | ICD-10-CM | POA: Diagnosis not present

## 2015-10-02 DIAGNOSIS — F5101 Primary insomnia: Secondary | ICD-10-CM | POA: Diagnosis not present

## 2015-10-02 DIAGNOSIS — I10 Essential (primary) hypertension: Secondary | ICD-10-CM | POA: Diagnosis not present

## 2015-10-02 DIAGNOSIS — M65342 Trigger finger, left ring finger: Secondary | ICD-10-CM | POA: Diagnosis not present

## 2015-10-02 DIAGNOSIS — R252 Cramp and spasm: Secondary | ICD-10-CM | POA: Diagnosis not present

## 2015-12-02 DIAGNOSIS — H2513 Age-related nuclear cataract, bilateral: Secondary | ICD-10-CM | POA: Diagnosis not present

## 2015-12-02 DIAGNOSIS — H00029 Hordeolum internum unspecified eye, unspecified eyelid: Secondary | ICD-10-CM | POA: Diagnosis not present

## 2015-12-03 DIAGNOSIS — L649 Androgenic alopecia, unspecified: Secondary | ICD-10-CM | POA: Diagnosis not present

## 2015-12-03 DIAGNOSIS — L820 Inflamed seborrheic keratosis: Secondary | ICD-10-CM | POA: Diagnosis not present

## 2015-12-03 DIAGNOSIS — L603 Nail dystrophy: Secondary | ICD-10-CM | POA: Diagnosis not present

## 2016-01-29 DIAGNOSIS — I10 Essential (primary) hypertension: Secondary | ICD-10-CM | POA: Diagnosis not present

## 2016-01-29 DIAGNOSIS — M65342 Trigger finger, left ring finger: Secondary | ICD-10-CM | POA: Diagnosis not present

## 2016-01-29 DIAGNOSIS — J301 Allergic rhinitis due to pollen: Secondary | ICD-10-CM | POA: Diagnosis not present

## 2016-02-12 ENCOUNTER — Other Ambulatory Visit: Payer: Self-pay | Admitting: Family Medicine

## 2016-02-12 DIAGNOSIS — F17200 Nicotine dependence, unspecified, uncomplicated: Secondary | ICD-10-CM

## 2016-04-13 ENCOUNTER — Ambulatory Visit
Admission: RE | Admit: 2016-04-13 | Discharge: 2016-04-13 | Disposition: A | Discharge status: Home / Self Care | Payer: Commercial Managed Care - HMO | Source: Ambulatory Visit | Attending: Family Medicine | Admitting: Family Medicine

## 2016-04-13 DIAGNOSIS — F17200 Nicotine dependence, unspecified, uncomplicated: Secondary | ICD-10-CM

## 2016-04-13 DIAGNOSIS — Z87891 Personal history of nicotine dependence: Secondary | ICD-10-CM | POA: Diagnosis not present

## 2016-08-30 DIAGNOSIS — F5101 Primary insomnia: Secondary | ICD-10-CM | POA: Diagnosis not present

## 2016-08-30 DIAGNOSIS — I10 Essential (primary) hypertension: Secondary | ICD-10-CM | POA: Diagnosis not present

## 2016-09-16 DIAGNOSIS — Z780 Asymptomatic menopausal state: Secondary | ICD-10-CM | POA: Diagnosis not present

## 2016-09-16 DIAGNOSIS — M85851 Other specified disorders of bone density and structure, right thigh: Secondary | ICD-10-CM | POA: Diagnosis not present

## 2016-09-16 DIAGNOSIS — Z1231 Encounter for screening mammogram for malignant neoplasm of breast: Secondary | ICD-10-CM | POA: Diagnosis not present

## 2017-02-09 IMAGING — CT CT CHEST LUNG CANCER SCREENING LOW DOSE W/O CM
1 of 5 series · 14 of 40 positions shown, 18 images · non-contrast
Comparison: No priors.

CLINICAL DATA: 65-year-old female former smoker (quit 1 year ago)
with 42 pack year history of smoking. Lung cancer screening
examination.

EXAM:
CT CHEST WITHOUT CONTRAST LOW-DOSE FOR LUNG CANCER SCREENING
TECHNIQUE: Multidetector CT imaging of the chest was performed following the
standard protocol without IV contrast.

[Series 3: lung windows · axial · 0.83mm/px · z∈[-307,-18]mm · 14 of 257 slices shown, 18 images]
[im 13/257  mediastinal]
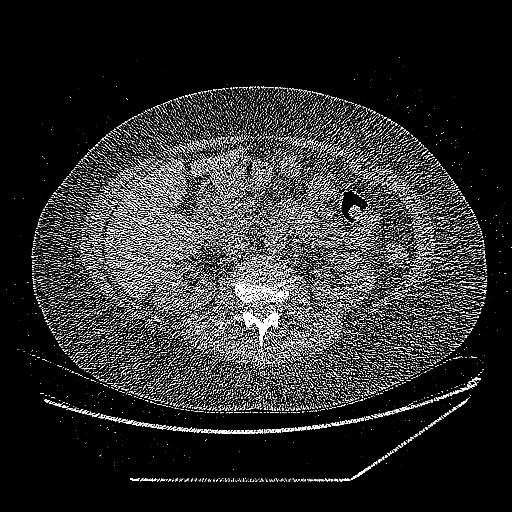
[im 13/257  lung]
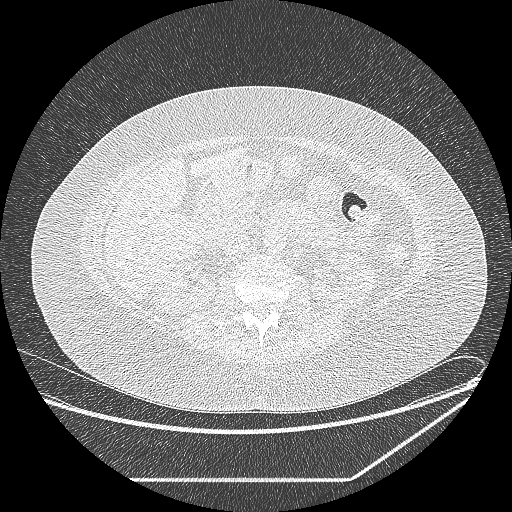
[im 37/257  lung]
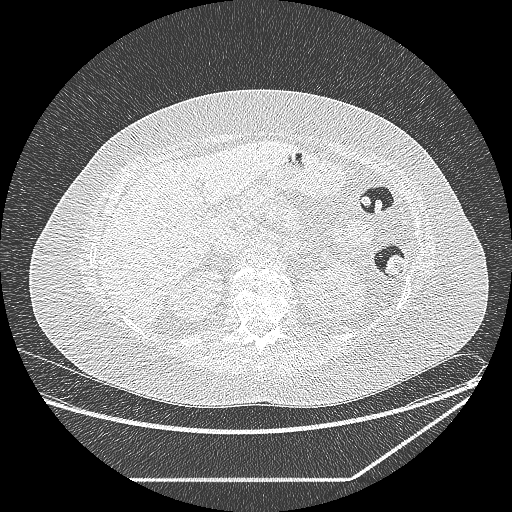
[im 49/257  lung]
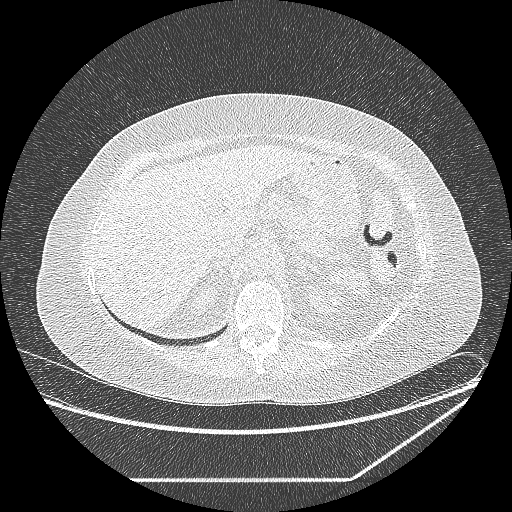
[im 74/257  lung]
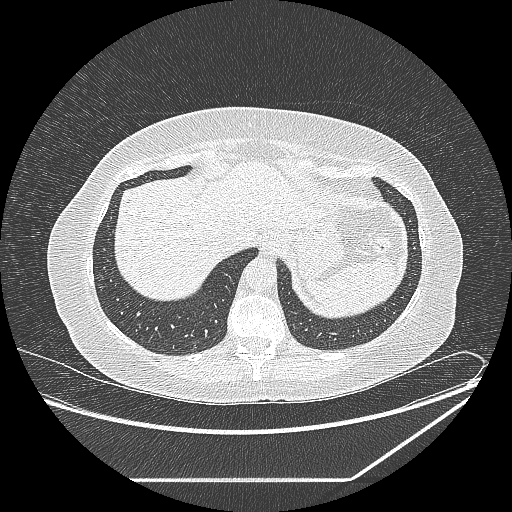
[im 86/257  mediastinal]
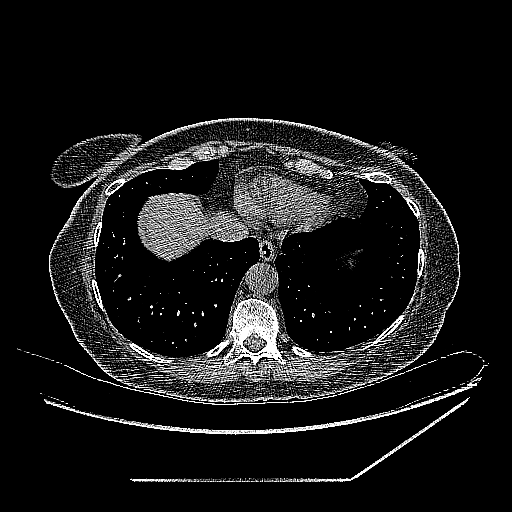
[im 86/257  lung]
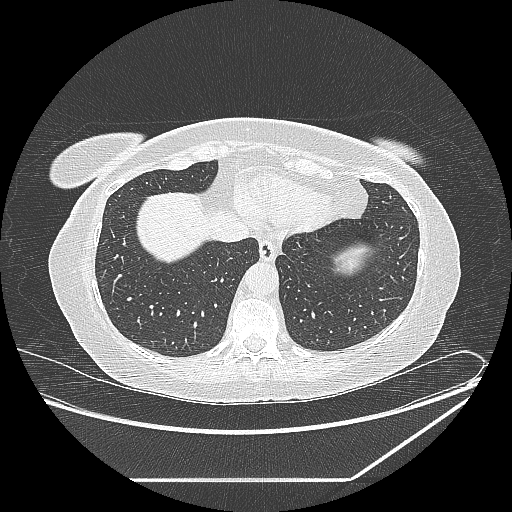
[im 98/257  lung]
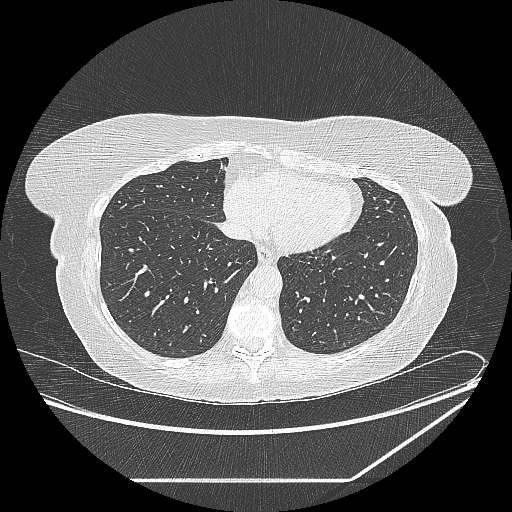
[im 122/257  lung]
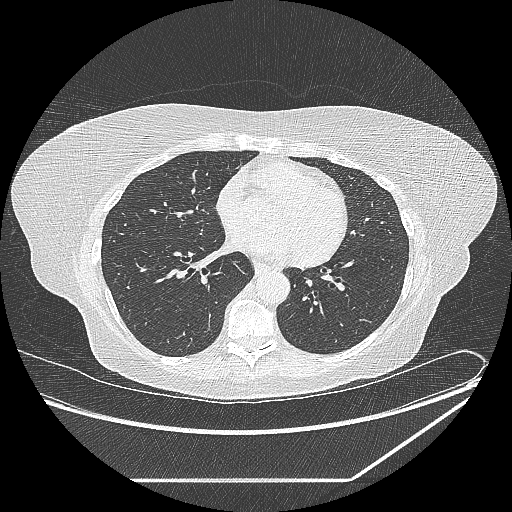
[im 135/257  lung]
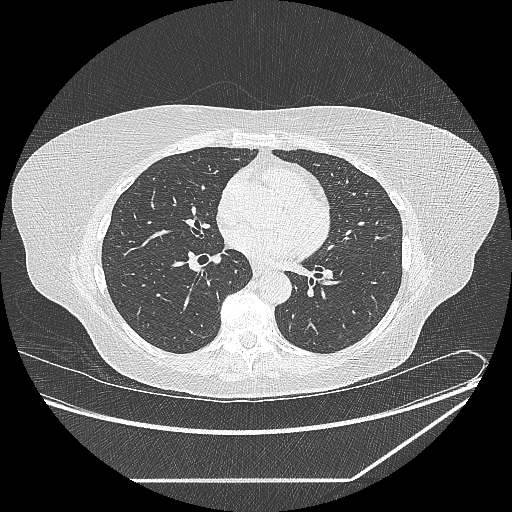
[im 159/257  mediastinal]
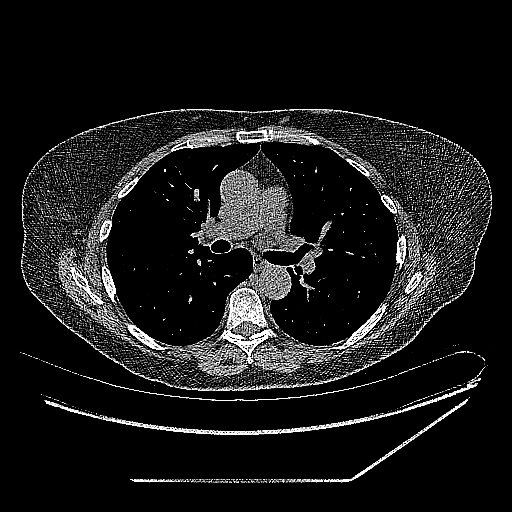
[im 159/257  lung]
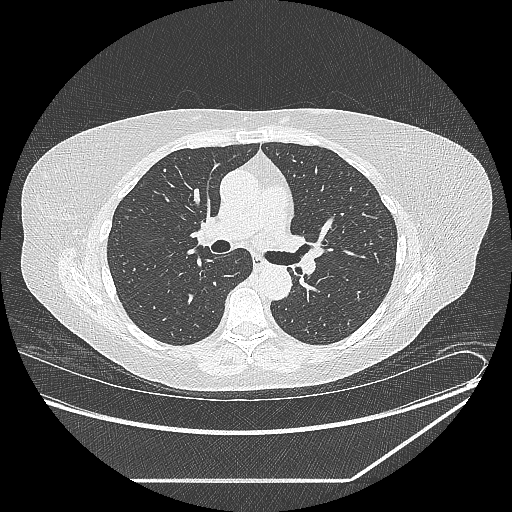
[im 171/257  lung]
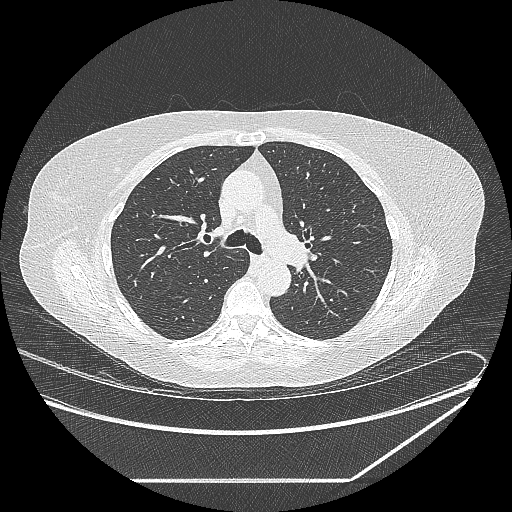
[im 196/257  lung]
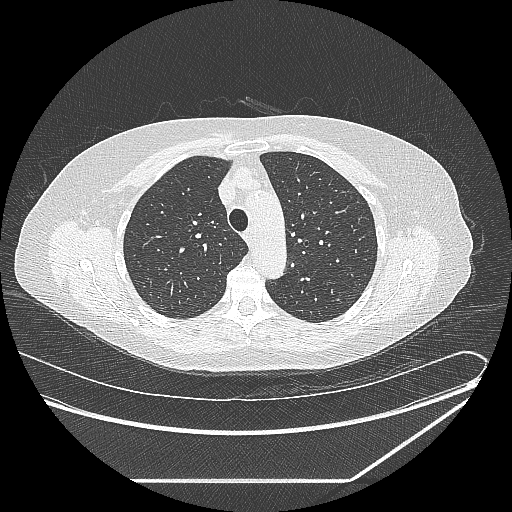
[im 208/257  lung]
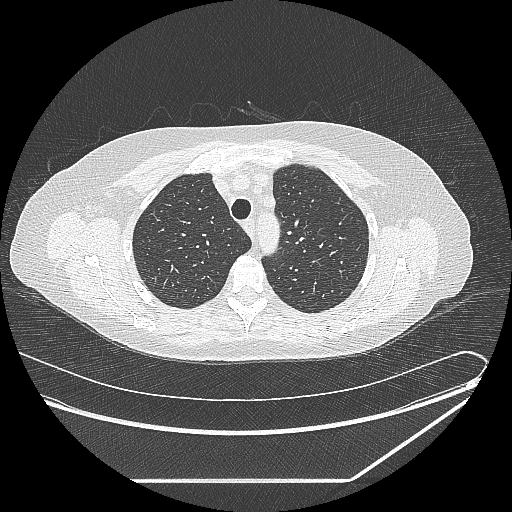
[im 220/257  mediastinal]
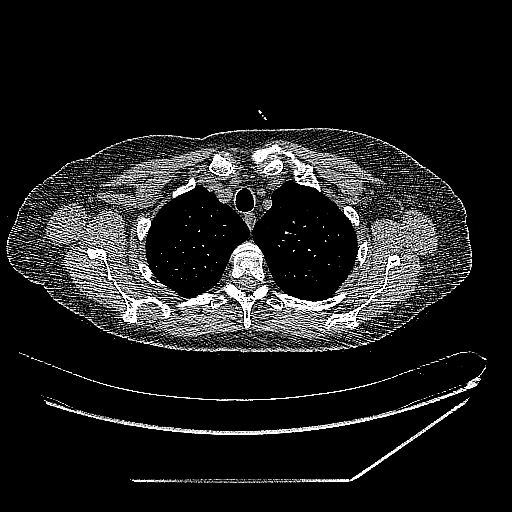
[im 220/257  lung]
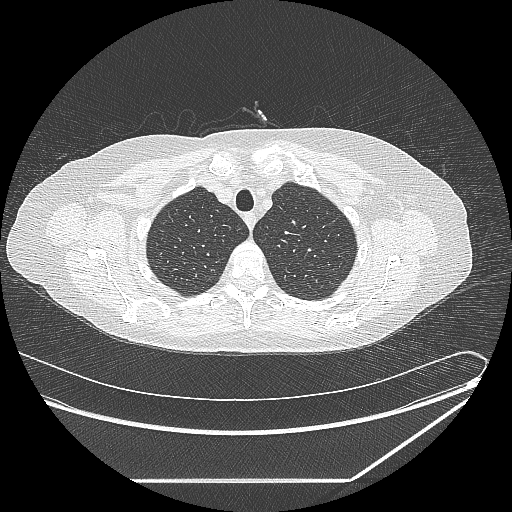
[im 244/257  lung]
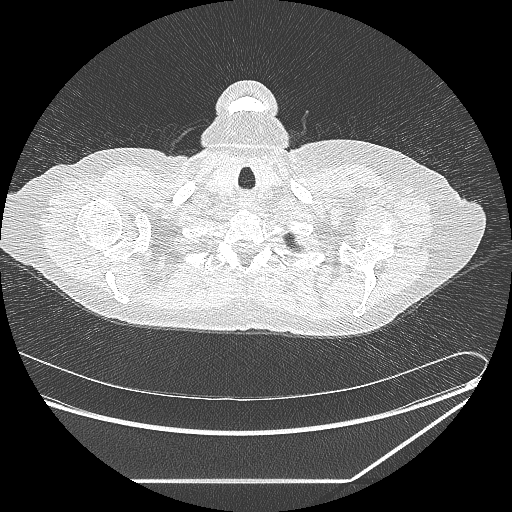

[14 of 40 positions shown; findings below may reference images not displayed]

FINDINGS: Cardiovascular: Heart size is normal. There is no significant
pericardial fluid, thickening or pericardial calcification. There is
aortic atherosclerosis, as well as atherosclerosis of the great
vessels of the mediastinum and the coronary arteries, including
calcified atherosclerotic plaque in the left anterior descending and
left circumflex coronary arteries. Faint calcifications of the
aortic valve.

Mediastinum/Nodes: No pathologically enlarged mediastinal or hilar
lymph nodes. Esophagus is unremarkable in appearance. No axillary
lymphadenopathy.

Lungs/Pleura: Small calcified granuloma in the right lung
incidentally noted. In addition, in the inferior aspect of the right
upper lobe associated with the minor fissure there is a tiny
subpleural pulmonary nodule with a volume derived mean diameter of
only 2.6 mm (image 104 of series 3). No larger more suspicious
appearing pulmonary nodules or masses are noted. No acute
consolidative airspace disease. No pleural effusions.

Upper Abdomen: Status post cholecystectomy.  Aortic atherosclerosis.

Musculoskeletal: There are no aggressive appearing lytic or blastic
lesions noted in the visualized portions of the skeleton.
IMPRESSION: 1. Lung-RADS Category 2S, benign appearance or behavior. Continue
annual screening with low-dose chest CT without contrast in 12
months.
2. The "S" modifier above refers to potentially clinically
significant non lung cancer related findings. Specifically, there is
aortic atherosclerosis, in addition to 2 vessel coronary artery
disease. Please note that although the presence of coronary artery
calcium documents the presence of coronary artery disease, the
severity of this disease and any potential stenosis cannot be
assessed on this non-gated CT examination. Assessment for potential
risk factor modification, dietary therapy or pharmacologic therapy
may be warranted, if clinically indicated.

## 2017-02-28 DIAGNOSIS — I10 Essential (primary) hypertension: Secondary | ICD-10-CM | POA: Diagnosis not present

## 2017-02-28 DIAGNOSIS — F5101 Primary insomnia: Secondary | ICD-10-CM | POA: Diagnosis not present

## 2017-02-28 DIAGNOSIS — F064 Anxiety disorder due to known physiological condition: Secondary | ICD-10-CM | POA: Diagnosis not present

## 2017-02-28 DIAGNOSIS — Z7289 Other problems related to lifestyle: Secondary | ICD-10-CM | POA: Diagnosis not present

## 2017-09-29 DIAGNOSIS — Z0000 Encounter for general adult medical examination without abnormal findings: Secondary | ICD-10-CM | POA: Diagnosis not present

## 2017-11-25 DIAGNOSIS — I10 Essential (primary) hypertension: Secondary | ICD-10-CM | POA: Diagnosis not present

## 2017-11-25 DIAGNOSIS — F064 Anxiety disorder due to known physiological condition: Secondary | ICD-10-CM | POA: Diagnosis not present

## 2017-11-25 DIAGNOSIS — K219 Gastro-esophageal reflux disease without esophagitis: Secondary | ICD-10-CM | POA: Diagnosis not present

## 2018-08-24 DIAGNOSIS — R7309 Other abnormal glucose: Secondary | ICD-10-CM | POA: Diagnosis not present

## 2018-08-24 DIAGNOSIS — I10 Essential (primary) hypertension: Secondary | ICD-10-CM | POA: Diagnosis not present

## 2018-08-24 DIAGNOSIS — F5101 Primary insomnia: Secondary | ICD-10-CM | POA: Diagnosis not present

## 2018-09-21 DIAGNOSIS — I10 Essential (primary) hypertension: Secondary | ICD-10-CM | POA: Diagnosis not present

## 2019-04-17 DIAGNOSIS — I10 Essential (primary) hypertension: Secondary | ICD-10-CM | POA: Diagnosis not present

## 2019-06-26 DIAGNOSIS — M545 Low back pain: Secondary | ICD-10-CM | POA: Diagnosis not present

## 2019-08-20 DIAGNOSIS — I10 Essential (primary) hypertension: Secondary | ICD-10-CM | POA: Diagnosis not present

## 2019-08-20 DIAGNOSIS — R5383 Other fatigue: Secondary | ICD-10-CM | POA: Diagnosis not present

## 2019-08-20 DIAGNOSIS — Z0000 Encounter for general adult medical examination without abnormal findings: Secondary | ICD-10-CM | POA: Diagnosis not present

## 2019-08-20 DIAGNOSIS — M549 Dorsalgia, unspecified: Secondary | ICD-10-CM | POA: Diagnosis not present

## 2019-08-20 DIAGNOSIS — G894 Chronic pain syndrome: Secondary | ICD-10-CM | POA: Diagnosis not present

## 2019-08-20 DIAGNOSIS — L659 Nonscarring hair loss, unspecified: Secondary | ICD-10-CM | POA: Diagnosis not present

## 2020-05-27 DIAGNOSIS — R7309 Other abnormal glucose: Secondary | ICD-10-CM | POA: Diagnosis not present

## 2020-05-27 DIAGNOSIS — I10 Essential (primary) hypertension: Secondary | ICD-10-CM | POA: Diagnosis not present

## 2020-05-27 DIAGNOSIS — R5383 Other fatigue: Secondary | ICD-10-CM | POA: Diagnosis not present

## 2020-06-24 DIAGNOSIS — I10 Essential (primary) hypertension: Secondary | ICD-10-CM | POA: Diagnosis not present

## 2020-06-24 DIAGNOSIS — R5383 Other fatigue: Secondary | ICD-10-CM | POA: Diagnosis not present

## 2020-06-24 DIAGNOSIS — J9809 Other diseases of bronchus, not elsewhere classified: Secondary | ICD-10-CM | POA: Diagnosis not present

## 2020-08-06 DIAGNOSIS — I10 Essential (primary) hypertension: Secondary | ICD-10-CM | POA: Diagnosis not present

## 2020-08-06 DIAGNOSIS — Z0001 Encounter for general adult medical examination with abnormal findings: Secondary | ICD-10-CM | POA: Diagnosis not present

## 2020-08-06 DIAGNOSIS — G894 Chronic pain syndrome: Secondary | ICD-10-CM | POA: Diagnosis not present

## 2020-08-06 DIAGNOSIS — M549 Dorsalgia, unspecified: Secondary | ICD-10-CM | POA: Diagnosis not present

## 2020-08-14 DIAGNOSIS — M542 Cervicalgia: Secondary | ICD-10-CM | POA: Diagnosis not present

## 2020-09-23 DIAGNOSIS — Z1231 Encounter for screening mammogram for malignant neoplasm of breast: Secondary | ICD-10-CM | POA: Diagnosis not present

## 2021-04-01 DIAGNOSIS — K588 Other irritable bowel syndrome: Secondary | ICD-10-CM | POA: Diagnosis not present

## 2021-04-01 DIAGNOSIS — I10 Essential (primary) hypertension: Secondary | ICD-10-CM | POA: Diagnosis not present

## 2021-04-01 DIAGNOSIS — E782 Mixed hyperlipidemia: Secondary | ICD-10-CM | POA: Diagnosis not present

## 2021-04-14 DIAGNOSIS — K5900 Constipation, unspecified: Secondary | ICD-10-CM | POA: Diagnosis not present

## 2021-04-14 DIAGNOSIS — R194 Change in bowel habit: Secondary | ICD-10-CM | POA: Diagnosis not present

## 2021-04-14 DIAGNOSIS — Z1211 Encounter for screening for malignant neoplasm of colon: Secondary | ICD-10-CM | POA: Diagnosis not present

## 2021-04-27 DIAGNOSIS — Z1211 Encounter for screening for malignant neoplasm of colon: Secondary | ICD-10-CM | POA: Diagnosis not present

## 2021-04-27 DIAGNOSIS — R194 Change in bowel habit: Secondary | ICD-10-CM | POA: Diagnosis not present

## 2021-04-27 DIAGNOSIS — K5730 Diverticulosis of large intestine without perforation or abscess without bleeding: Secondary | ICD-10-CM | POA: Diagnosis not present

## 2021-09-02 DIAGNOSIS — I10 Essential (primary) hypertension: Secondary | ICD-10-CM | POA: Diagnosis not present

## 2021-09-02 DIAGNOSIS — G894 Chronic pain syndrome: Secondary | ICD-10-CM | POA: Diagnosis not present

## 2021-09-29 DIAGNOSIS — Z1231 Encounter for screening mammogram for malignant neoplasm of breast: Secondary | ICD-10-CM | POA: Diagnosis not present

## 2021-09-30 DIAGNOSIS — E782 Mixed hyperlipidemia: Secondary | ICD-10-CM | POA: Diagnosis not present

## 2021-09-30 DIAGNOSIS — F5101 Primary insomnia: Secondary | ICD-10-CM | POA: Diagnosis not present

## 2021-11-04 DIAGNOSIS — M8589 Other specified disorders of bone density and structure, multiple sites: Secondary | ICD-10-CM | POA: Diagnosis not present

## 2021-11-04 DIAGNOSIS — Z780 Asymptomatic menopausal state: Secondary | ICD-10-CM | POA: Diagnosis not present

## 2021-11-30 DIAGNOSIS — I10 Essential (primary) hypertension: Secondary | ICD-10-CM | POA: Diagnosis not present

## 2021-11-30 DIAGNOSIS — Z0000 Encounter for general adult medical examination without abnormal findings: Secondary | ICD-10-CM | POA: Diagnosis not present

## 2021-12-31 DIAGNOSIS — I10 Essential (primary) hypertension: Secondary | ICD-10-CM | POA: Diagnosis not present

## 2022-02-08 DIAGNOSIS — I10 Essential (primary) hypertension: Secondary | ICD-10-CM | POA: Diagnosis not present

## 2022-05-13 DIAGNOSIS — M549 Dorsalgia, unspecified: Secondary | ICD-10-CM | POA: Diagnosis not present

## 2022-05-13 DIAGNOSIS — E782 Mixed hyperlipidemia: Secondary | ICD-10-CM | POA: Diagnosis not present

## 2022-05-13 DIAGNOSIS — G5603 Carpal tunnel syndrome, bilateral upper limbs: Secondary | ICD-10-CM | POA: Diagnosis not present

## 2022-05-13 DIAGNOSIS — E1169 Type 2 diabetes mellitus with other specified complication: Secondary | ICD-10-CM | POA: Diagnosis not present

## 2022-05-13 DIAGNOSIS — M21371 Foot drop, right foot: Secondary | ICD-10-CM | POA: Diagnosis not present

## 2022-05-13 DIAGNOSIS — I10 Essential (primary) hypertension: Secondary | ICD-10-CM | POA: Diagnosis not present

## 2022-05-13 DIAGNOSIS — Z23 Encounter for immunization: Secondary | ICD-10-CM | POA: Diagnosis not present

## 2022-06-14 DIAGNOSIS — I10 Essential (primary) hypertension: Secondary | ICD-10-CM | POA: Diagnosis not present

## 2022-06-14 DIAGNOSIS — G894 Chronic pain syndrome: Secondary | ICD-10-CM | POA: Diagnosis not present

## 2022-06-14 DIAGNOSIS — E782 Mixed hyperlipidemia: Secondary | ICD-10-CM | POA: Diagnosis not present

## 2022-08-09 DIAGNOSIS — G629 Polyneuropathy, unspecified: Secondary | ICD-10-CM | POA: Diagnosis not present

## 2022-08-10 DIAGNOSIS — G5603 Carpal tunnel syndrome, bilateral upper limbs: Secondary | ICD-10-CM | POA: Diagnosis not present

## 2022-09-13 DIAGNOSIS — I10 Essential (primary) hypertension: Secondary | ICD-10-CM | POA: Diagnosis not present

## 2022-09-13 DIAGNOSIS — R69 Illness, unspecified: Secondary | ICD-10-CM | POA: Diagnosis not present

## 2022-09-13 DIAGNOSIS — R634 Abnormal weight loss: Secondary | ICD-10-CM | POA: Diagnosis not present

## 2022-10-01 DIAGNOSIS — Z1231 Encounter for screening mammogram for malignant neoplasm of breast: Secondary | ICD-10-CM | POA: Diagnosis not present

## 2022-10-26 DIAGNOSIS — R69 Illness, unspecified: Secondary | ICD-10-CM | POA: Diagnosis not present

## 2022-10-26 DIAGNOSIS — I10 Essential (primary) hypertension: Secondary | ICD-10-CM | POA: Diagnosis not present

## 2022-10-26 DIAGNOSIS — K8000 Calculus of gallbladder with acute cholecystitis without obstruction: Secondary | ICD-10-CM | POA: Diagnosis not present

## 2022-10-26 DIAGNOSIS — Z716 Tobacco abuse counseling: Secondary | ICD-10-CM | POA: Diagnosis not present

## 2022-12-27 DIAGNOSIS — F4000 Agoraphobia, unspecified: Secondary | ICD-10-CM | POA: Diagnosis not present

## 2022-12-27 DIAGNOSIS — F4321 Adjustment disorder with depressed mood: Secondary | ICD-10-CM | POA: Diagnosis not present

## 2022-12-27 DIAGNOSIS — E7800 Pure hypercholesterolemia, unspecified: Secondary | ICD-10-CM | POA: Diagnosis not present

## 2022-12-27 DIAGNOSIS — I10 Essential (primary) hypertension: Secondary | ICD-10-CM | POA: Diagnosis not present

## 2023-01-24 DIAGNOSIS — G894 Chronic pain syndrome: Secondary | ICD-10-CM | POA: Diagnosis not present

## 2023-01-24 DIAGNOSIS — L659 Nonscarring hair loss, unspecified: Secondary | ICD-10-CM | POA: Diagnosis not present

## 2023-01-24 DIAGNOSIS — F4321 Adjustment disorder with depressed mood: Secondary | ICD-10-CM | POA: Diagnosis not present

## 2023-01-24 DIAGNOSIS — I10 Essential (primary) hypertension: Secondary | ICD-10-CM | POA: Diagnosis not present

## 2023-01-24 DIAGNOSIS — E7800 Pure hypercholesterolemia, unspecified: Secondary | ICD-10-CM | POA: Diagnosis not present

## 2023-01-24 DIAGNOSIS — F900 Attention-deficit hyperactivity disorder, predominantly inattentive type: Secondary | ICD-10-CM | POA: Diagnosis not present

## 2023-01-24 DIAGNOSIS — F4000 Agoraphobia, unspecified: Secondary | ICD-10-CM | POA: Diagnosis not present

## 2023-07-01 DIAGNOSIS — I10 Essential (primary) hypertension: Secondary | ICD-10-CM | POA: Diagnosis not present

## 2023-07-01 DIAGNOSIS — E7800 Pure hypercholesterolemia, unspecified: Secondary | ICD-10-CM | POA: Diagnosis not present

## 2023-08-18 DIAGNOSIS — H43813 Vitreous degeneration, bilateral: Secondary | ICD-10-CM | POA: Diagnosis not present

## 2023-08-18 DIAGNOSIS — H25813 Combined forms of age-related cataract, bilateral: Secondary | ICD-10-CM | POA: Diagnosis not present

## 2023-08-18 DIAGNOSIS — H04123 Dry eye syndrome of bilateral lacrimal glands: Secondary | ICD-10-CM | POA: Diagnosis not present

## 2023-08-26 DIAGNOSIS — M25511 Pain in right shoulder: Secondary | ICD-10-CM | POA: Diagnosis not present

## 2023-09-23 DIAGNOSIS — M25511 Pain in right shoulder: Secondary | ICD-10-CM | POA: Diagnosis not present

## 2023-09-29 DIAGNOSIS — E782 Mixed hyperlipidemia: Secondary | ICD-10-CM | POA: Diagnosis not present

## 2023-09-29 DIAGNOSIS — M25511 Pain in right shoulder: Secondary | ICD-10-CM | POA: Diagnosis not present

## 2023-09-29 DIAGNOSIS — F323 Major depressive disorder, single episode, severe with psychotic features: Secondary | ICD-10-CM | POA: Diagnosis not present

## 2023-09-29 DIAGNOSIS — Z6826 Body mass index (BMI) 26.0-26.9, adult: Secondary | ICD-10-CM | POA: Diagnosis not present

## 2023-09-29 DIAGNOSIS — L853 Xerosis cutis: Secondary | ICD-10-CM | POA: Diagnosis not present

## 2023-09-29 DIAGNOSIS — F4010 Social phobia, unspecified: Secondary | ICD-10-CM | POA: Diagnosis not present

## 2023-09-29 DIAGNOSIS — I10 Essential (primary) hypertension: Secondary | ICD-10-CM | POA: Diagnosis not present

## 2023-09-29 DIAGNOSIS — G894 Chronic pain syndrome: Secondary | ICD-10-CM | POA: Diagnosis not present

## 2023-09-29 DIAGNOSIS — F4000 Agoraphobia, unspecified: Secondary | ICD-10-CM | POA: Diagnosis not present

## 2023-10-03 DIAGNOSIS — Z1231 Encounter for screening mammogram for malignant neoplasm of breast: Secondary | ICD-10-CM | POA: Diagnosis not present

## 2023-10-07 DIAGNOSIS — N6321 Unspecified lump in the left breast, upper outer quadrant: Secondary | ICD-10-CM | POA: Diagnosis not present

## 2023-10-07 DIAGNOSIS — N6002 Solitary cyst of left breast: Secondary | ICD-10-CM | POA: Diagnosis not present

## 2023-10-15 DIAGNOSIS — M25511 Pain in right shoulder: Secondary | ICD-10-CM | POA: Diagnosis not present

## 2023-10-24 DIAGNOSIS — M25511 Pain in right shoulder: Secondary | ICD-10-CM | POA: Diagnosis not present

## 2023-11-03 DIAGNOSIS — S46011D Strain of muscle(s) and tendon(s) of the rotator cuff of right shoulder, subsequent encounter: Secondary | ICD-10-CM | POA: Diagnosis not present

## 2023-11-03 DIAGNOSIS — M75101 Unspecified rotator cuff tear or rupture of right shoulder, not specified as traumatic: Secondary | ICD-10-CM | POA: Diagnosis not present

## 2023-11-07 DIAGNOSIS — M810 Age-related osteoporosis without current pathological fracture: Secondary | ICD-10-CM | POA: Diagnosis not present

## 2023-11-11 DIAGNOSIS — R5383 Other fatigue: Secondary | ICD-10-CM | POA: Diagnosis not present

## 2023-11-11 DIAGNOSIS — E782 Mixed hyperlipidemia: Secondary | ICD-10-CM | POA: Diagnosis not present

## 2023-11-11 DIAGNOSIS — I10 Essential (primary) hypertension: Secondary | ICD-10-CM | POA: Diagnosis not present

## 2023-12-06 DIAGNOSIS — M25511 Pain in right shoulder: Secondary | ICD-10-CM | POA: Diagnosis not present

## 2023-12-13 DIAGNOSIS — M25511 Pain in right shoulder: Secondary | ICD-10-CM | POA: Diagnosis not present

## 2023-12-22 DIAGNOSIS — F4000 Agoraphobia, unspecified: Secondary | ICD-10-CM | POA: Diagnosis not present

## 2023-12-22 DIAGNOSIS — F064 Anxiety disorder due to known physiological condition: Secondary | ICD-10-CM | POA: Diagnosis not present

## 2023-12-22 DIAGNOSIS — M19071 Primary osteoarthritis, right ankle and foot: Secondary | ICD-10-CM | POA: Diagnosis not present

## 2023-12-22 DIAGNOSIS — K8000 Calculus of gallbladder with acute cholecystitis without obstruction: Secondary | ICD-10-CM | POA: Diagnosis not present

## 2023-12-22 DIAGNOSIS — I10 Essential (primary) hypertension: Secondary | ICD-10-CM | POA: Diagnosis not present

## 2024-01-03 DIAGNOSIS — M25511 Pain in right shoulder: Secondary | ICD-10-CM | POA: Diagnosis not present

## 2024-02-20 DIAGNOSIS — G8918 Other acute postprocedural pain: Secondary | ICD-10-CM | POA: Diagnosis not present

## 2024-02-20 DIAGNOSIS — S46011A Strain of muscle(s) and tendon(s) of the rotator cuff of right shoulder, initial encounter: Secondary | ICD-10-CM | POA: Diagnosis not present

## 2024-02-20 DIAGNOSIS — X58XXXA Exposure to other specified factors, initial encounter: Secondary | ICD-10-CM | POA: Diagnosis not present

## 2024-02-20 DIAGNOSIS — Y999 Unspecified external cause status: Secondary | ICD-10-CM | POA: Diagnosis not present

## 2024-02-20 DIAGNOSIS — M19011 Primary osteoarthritis, right shoulder: Secondary | ICD-10-CM | POA: Diagnosis not present

## 2024-03-06 DIAGNOSIS — Z4889 Encounter for other specified surgical aftercare: Secondary | ICD-10-CM | POA: Diagnosis not present

## 2024-03-23 DIAGNOSIS — F4000 Agoraphobia, unspecified: Secondary | ICD-10-CM | POA: Diagnosis not present

## 2024-03-23 DIAGNOSIS — I10 Essential (primary) hypertension: Secondary | ICD-10-CM | POA: Diagnosis not present

## 2024-03-23 DIAGNOSIS — F323 Major depressive disorder, single episode, severe with psychotic features: Secondary | ICD-10-CM | POA: Diagnosis not present

## 2024-03-23 DIAGNOSIS — Z716 Tobacco abuse counseling: Secondary | ICD-10-CM | POA: Diagnosis not present

## 2024-03-26 ENCOUNTER — Other Ambulatory Visit (HOSPITAL_COMMUNITY): Payer: Self-pay

## 2024-04-02 ENCOUNTER — Other Ambulatory Visit (HOSPITAL_COMMUNITY): Payer: Self-pay

## 2024-04-02 DIAGNOSIS — Z008 Encounter for other general examination: Secondary | ICD-10-CM | POA: Diagnosis not present

## 2024-04-02 MED ORDER — CAPVAXIVE 0.5 ML IM SOSY
PREFILLED_SYRINGE | INTRAMUSCULAR | 0 refills | Status: AC
  Filled 2024-04-02: qty 0.5, 1d supply, fill #0

## 2024-04-02 MED ORDER — COVID-19 MRNA VAC-TRIS(PFIZER) 30 MCG/0.3ML IM SUSY
0.3000 mL | PREFILLED_SYRINGE | Freq: Once | INTRAMUSCULAR | 0 refills | Status: AC
  Filled 2024-04-02: qty 0.3, 1d supply, fill #0

## 2024-04-02 MED ORDER — FLUZONE HIGH-DOSE 0.5 ML IM SUSY
0.5000 mL | PREFILLED_SYRINGE | Freq: Once | INTRAMUSCULAR | 0 refills | Status: AC
  Filled 2024-04-02: qty 0.5, 1d supply, fill #0

## 2024-06-26 DIAGNOSIS — I10 Essential (primary) hypertension: Secondary | ICD-10-CM | POA: Diagnosis not present

## 2024-07-25 ENCOUNTER — Encounter: Payer: Self-pay | Admitting: *Deleted

## 2024-07-25 NOTE — Progress Notes (Signed)
 JAQUETTA CURRIER                                          MRN: [id number]   07/25/2024   The [facility] Quality Team Specialist reviewed this patient medical record for the purposes of chart review for care gap closure. The following were reviewed: chart review for care gap closure-controlling blood pressure.    [facility] Quality Team
